# Patient Record
Sex: Female | Born: 1995 | Race: Black or African American | Hispanic: No | Marital: Single | Smoking: Former smoker
Health system: Southern US, Community
[De-identification: ages and names within clinical notes are randomized; demographics above are authoritative.]

## PROBLEM LIST (undated history)

## (undated) DIAGNOSIS — K529 Noninfective gastroenteritis and colitis, unspecified: Secondary | ICD-10-CM

## (undated) DIAGNOSIS — B3781 Candidal esophagitis: Secondary | ICD-10-CM

## (undated) DIAGNOSIS — K859 Acute pancreatitis without necrosis or infection, unspecified: Secondary | ICD-10-CM

## (undated) HISTORY — PX: NO PAST SURGERIES: SHX2092

---
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
## 2015-08-08 DIAGNOSIS — S66211A Strain of extensor muscle, fascia and tendon of right thumb at wrist and hand level, initial encounter: Secondary | ICD-10-CM | POA: Diagnosis not present

## 2015-08-08 DIAGNOSIS — Z113 Encounter for screening for infections with a predominantly sexual mode of transmission: Secondary | ICD-10-CM | POA: Diagnosis not present

## 2015-08-08 DIAGNOSIS — Z7251 High risk heterosexual behavior: Secondary | ICD-10-CM | POA: Diagnosis not present

## 2015-08-18 DIAGNOSIS — M545 Low back pain: Secondary | ICD-10-CM | POA: Diagnosis not present

## 2015-08-25 DIAGNOSIS — M545 Low back pain: Secondary | ICD-10-CM | POA: Diagnosis not present

## 2015-09-18 DIAGNOSIS — E559 Vitamin D deficiency, unspecified: Secondary | ICD-10-CM | POA: Diagnosis not present

## 2015-09-18 DIAGNOSIS — M545 Low back pain: Secondary | ICD-10-CM | POA: Diagnosis not present

## 2015-12-16 ENCOUNTER — Encounter (HOSPITAL_BASED_OUTPATIENT_CLINIC_OR_DEPARTMENT_OTHER): Payer: Self-pay | Admitting: *Deleted

## 2015-12-16 ENCOUNTER — Emergency Department (HOSPITAL_BASED_OUTPATIENT_CLINIC_OR_DEPARTMENT_OTHER)
Admission: EM | Admit: 2015-12-16 | Discharge: 2015-12-17 | Disposition: A | Discharge status: Home / Self Care | Payer: BLUE CROSS/BLUE SHIELD | Source: Home / Self Care | Attending: Emergency Medicine | Admitting: Emergency Medicine

## 2015-12-16 DIAGNOSIS — R109 Unspecified abdominal pain: Secondary | ICD-10-CM | POA: Diagnosis not present

## 2015-12-16 DIAGNOSIS — R1084 Generalized abdominal pain: Secondary | ICD-10-CM | POA: Diagnosis present

## 2015-12-16 DIAGNOSIS — N390 Urinary tract infection, site not specified: Secondary | ICD-10-CM

## 2015-12-16 DIAGNOSIS — N83209 Unspecified ovarian cyst, unspecified side: Secondary | ICD-10-CM | POA: Diagnosis not present

## 2015-12-16 DIAGNOSIS — Z202 Contact with and (suspected) exposure to infections with a predominantly sexual mode of transmission: Secondary | ICD-10-CM | POA: Diagnosis not present

## 2015-12-16 DIAGNOSIS — F17200 Nicotine dependence, unspecified, uncomplicated: Secondary | ICD-10-CM | POA: Insufficient documentation

## 2015-12-16 DIAGNOSIS — N83299 Other ovarian cyst, unspecified side: Secondary | ICD-10-CM | POA: Diagnosis not present

## 2015-12-16 DIAGNOSIS — F17210 Nicotine dependence, cigarettes, uncomplicated: Secondary | ICD-10-CM | POA: Diagnosis not present

## 2015-12-16 HISTORY — DX: Noninfective gastroenteritis and colitis, unspecified: K52.9

## 2015-12-16 LAB — WET PREP, GENITAL
Clue Cells Wet Prep HPF POC: NONE SEEN
Sperm: NONE SEEN
Trich, Wet Prep: NONE SEEN
Yeast Wet Prep HPF POC: NONE SEEN

## 2015-12-16 LAB — PREGNANCY, URINE: PREG TEST UR: NEGATIVE

## 2015-12-16 LAB — URINALYSIS, ROUTINE W REFLEX MICROSCOPIC
Glucose, UA: NEGATIVE mg/dL
Hgb urine dipstick: NEGATIVE
Ketones, ur: NEGATIVE mg/dL
Nitrite: NEGATIVE
Protein, ur: NEGATIVE mg/dL
Specific Gravity, Urine: 1.039 — ABNORMAL HIGH (ref 1.005–1.030)
pH: 6 (ref 5.0–8.0)

## 2015-12-16 LAB — URINE MICROSCOPIC-ADD ON

## 2015-12-16 NOTE — ED Notes (Signed)
Pelvic cart placed outside pt's room

## 2015-12-16 NOTE — ED Triage Notes (Signed)
Pt describes left side abd pain onset 6p. Denies other s/s. Last BM 7p. Menses 2 weeks ago. Denies UTI s/s.

## 2015-12-16 NOTE — ED Provider Notes (Signed)
[facility] Note   CSN: [id number] Arrival date & time: 12/16/15  2240  First Provider Contact:   First MD Initiated Contact with Patient 12/16/15 2313      By signing my name below, I, Soijett Blue, attest that this documentation has been prepared under the direction and in the presence of Tomasita Crumble, MD. Electronically Signed: Soijett Blue, ED Scribe. 12/16/15. 11:16 PM.    History   Chief Complaint Chief Complaint  Patient presents with  . Abdominal Pain    HPI  Debra Thornton is a 20 y.o. female who presents to the Emergency Department complaining of left sided abdominal pain onset 6:40 PM tonight. Pt notes that she was at work tonight when she had sudden onset left sided abdominal pain. Pt reports that her left sided abdominal pain radiated to her lower abdomen. Denies her abdominal pain radiating to her back. Pt states that her current symptoms feel similar to the start of her menstrual cycle. Pt notes that she was seen in the ED in December and dx with colitis and treated with abx. She states that she has not tried any medications for the relief for her symptoms. She denies vomiting, diarrhea, difficulty urinating, dysuria, hematuria, vaginal bleeding, vaginal discharge, fever, and any other symptoms.  The history is provided by the patient. No language interpreter was used.    Past Medical History:  Diagnosis Date  . Colitis     There are no active problems to display for this patient.   History reviewed. No pertinent surgical history.  OB History    No data available       Home Medications    Prior to Admission medications   Not on File    Family History History reviewed. No pertinent family history.  Social History Social History  Substance Use Topics  . Smoking status: Current Every Day Smoker  . Smokeless tobacco: Never Used  . Alcohol use No     Allergies   Review of patient's allergies indicates no known  allergies.   Review of Systems Review of Systems  A complete 10 system review of systems was obtained and all systems are negative except as noted in the HPI and PMH.    Physical Exam Updated Vital Signs BP 114/77 (BP Location: Left Arm)   Pulse 80   Temp 98.3 F (36.8 C) (Oral)   Resp 18   Ht 5' 4.5" (1.638 m)   Wt 175 lb (79.4 kg)   LMP 12/02/2015 (Exact Date)   SpO2 100%   BMI 29.57 kg/m   Physical Exam  Constitutional: She is oriented to person, place, and time. She appears well-developed and well-nourished. No distress.  HENT:  Head: Normocephalic and atraumatic.  Nose: Nose normal.  Mouth/Throat: Oropharynx is clear and moist. No oropharyngeal exudate.  Eyes: Conjunctivae and EOM are normal. Pupils are equal, round, and reactive to light. No scleral icterus.  Neck: Normal range of motion. Neck supple. No JVD present. No tracheal deviation present. No thyromegaly present.  Cardiovascular: Normal rate, regular rhythm and normal heart sounds.  Exam reveals no gallop and no friction rub.   No murmur heard. Pulmonary/Chest: Effort normal and breath sounds normal. No respiratory distress. She has no wheezes. She exhibits no tenderness.  Abdominal: Soft. Bowel sounds are normal. She exhibits no distension and no mass. There is no tenderness. There is no rebound and no guarding.  Musculoskeletal: Normal range of motion. She exhibits no edema or tenderness.  Lymphadenopathy:  She has no cervical adenopathy.  Neurological: She is alert and oriented to person, place, and time. No cranial nerve deficit. She exhibits normal muscle tone.  Skin: Skin is warm and dry. No rash noted. No erythema. No pallor.  Nursing note and vitals reviewed.    ED Treatments / Results  DIAGNOSTIC STUDIES: Oxygen Saturation is 100% on RA, nl by my interpretation.    COORDINATION OF CARE: 11:16 PM Discussed treatment plan with pt at bedside which includes UA, labs, and pt agreed to  plan.   Labs (all labs ordered are listed, but only abnormal results are displayed) Labs Reviewed  WET PREP, GENITAL - Abnormal; Notable for the following:       Result Value   WBC, Wet Prep HPF POC MANY (*)    All other components within normal limits  URINALYSIS, ROUTINE W REFLEX MICROSCOPIC (NOT AT [facility]) - Abnormal; Notable for the following:    APPearance CLOUDY (*)    Specific Gravity, Urine 1.039 (*)    Bilirubin Urine SMALL (*)    Leukocytes, UA TRACE (*)    All other components within normal limits  URINE MICROSCOPIC-ADD ON - Abnormal; Notable for the following:    Squamous Epithelial / LPF 6-30 (*)    Bacteria, UA MANY (*)    All other components within normal limits  COMPREHENSIVE METABOLIC PANEL - Abnormal; Notable for the following:    ALT 11 (*)    All other components within normal limits  PREGNANCY, URINE  LIPASE, BLOOD  CBC WITH DIFFERENTIAL/PLATELET  HIV ANTIBODY (ROUTINE TESTING)  CBC WITH DIFFERENTIAL/PLATELET  GC/CHLAMYDIA PROBE AMP ([place]) NOT AT [facility]    EKG  EKG Interpretation None       Radiology No results found.  Procedures Procedures (including critical care time)  Medications Ordered in ED Medications  cefTRIAXone (ROCEPHIN) injection 250 mg (not administered)  azithromycin (ZITHROMAX) tablet 1,000 mg (not administered)     Initial Impression / Assessment and Plan / ED Course  I have reviewed the triage vital signs and the nursing notes.  Pertinent labs & imaging results that were available during my care of the patient were reviewed by me and considered in my medical decision making (see chart for details).  Clinical Course    Patient presents to the ED for lower abdominal pain.  She will need pelvic exam, I am awaiting the set up.  UA does reveal some bacteria.  Labs pending.  Pelvic exam show moderate DC, friable cervix with mild bleeding, no CMT or adnexal tenderness.  She could have an STD.  Will treat in the ED for  ceftriaxcone and azithro, Dc home with macrobid for possible UTI.  PCP fu advised within 3 days.  VS remain within her normal limits and she is safe for DC.  Final Clinical Impressions(s) / ED Diagnoses   Final diagnoses:  None    New Prescriptions New Prescriptions   No medications on file     I personally performed the services described in this documentation, which was scribed in my presence. The recorded information has been reviewed and is accurate.       Tomasita CrumbleAdeleke Quinita Kostelecky, MD 12/17/15 [phone]

## 2015-12-17 ENCOUNTER — Emergency Department (HOSPITAL_COMMUNITY): Payer: BLUE CROSS/BLUE SHIELD

## 2015-12-17 ENCOUNTER — Emergency Department (HOSPITAL_COMMUNITY)
Admission: EM | Admit: 2015-12-17 | Discharge: 2015-12-17 | Disposition: A | Discharge status: Home / Self Care | Payer: BLUE CROSS/BLUE SHIELD | Attending: Emergency Medicine | Admitting: Emergency Medicine

## 2015-12-17 ENCOUNTER — Encounter (HOSPITAL_COMMUNITY): Payer: Self-pay | Admitting: Emergency Medicine

## 2015-12-17 DIAGNOSIS — N83209 Unspecified ovarian cyst, unspecified side: Secondary | ICD-10-CM | POA: Diagnosis not present

## 2015-12-17 DIAGNOSIS — R109 Unspecified abdominal pain: Secondary | ICD-10-CM

## 2015-12-17 DIAGNOSIS — F17210 Nicotine dependence, cigarettes, uncomplicated: Secondary | ICD-10-CM | POA: Insufficient documentation

## 2015-12-17 DIAGNOSIS — N83299 Other ovarian cyst, unspecified side: Secondary | ICD-10-CM | POA: Insufficient documentation

## 2015-12-17 LAB — COMPREHENSIVE METABOLIC PANEL
ALBUMIN: 4.7 g/dL (ref 3.5–5.0)
ALK PHOS: 49 U/L (ref 38–126)
ALT: 15 U/L (ref 14–54)
ALT: 11 U/L — AB (ref 14–54)
ANION GAP: 13 (ref 5–15)
AST: 22 U/L (ref 15–41)
AST: 17 U/L (ref 15–41)
Albumin: 4.1 g/dL (ref 3.5–5.0)
Alkaline Phosphatase: 44 U/L (ref 38–126)
Anion gap: 6 (ref 5–15)
BUN: 12 mg/dL (ref 6–20)
BUN: 13 mg/dL (ref 6–20)
CHLORIDE: 106 mmol/L (ref 101–111)
CO2: 19 mmol/L — AB (ref 22–32)
CO2: 24 mmol/L (ref 22–32)
CREATININE: 0.81 mg/dL (ref 0.44–1.00)
Calcium: 9.2 mg/dL (ref 8.9–10.3)
Calcium: 9.8 mg/dL (ref 8.9–10.3)
Chloride: 108 mmol/L (ref 101–111)
Creatinine, Ser: 0.88 mg/dL (ref 0.44–1.00)
GFR calc Af Amer: >60 mL/min (ref >60)
GFR calc Af Amer: >60 mL/min (ref >60)
GFR calc non Af Amer: >60 mL/min (ref >60)
GLUCOSE: 144 mg/dL — AB (ref 65–99)
Glucose, Bld: 90 mg/dL (ref 65–99)
POTASSIUM: 3 mmol/L — AB (ref 3.5–5.1)
Potassium: 4 mmol/L (ref 3.5–5.1)
SODIUM: 138 mmol/L (ref 135–145)
SODIUM: 138 mmol/L (ref 135–145)
TOTAL PROTEIN: 7.2 g/dL (ref 6.5–8.1)
Total Bilirubin: 0.5 mg/dL (ref 0.3–1.2)
Total Bilirubin: 0.5 mg/dL (ref 0.3–1.2)
Total Protein: 7.7 g/dL (ref 6.5–8.1)

## 2015-12-17 LAB — URINALYSIS, ROUTINE W REFLEX MICROSCOPIC
Bilirubin Urine: NEGATIVE
GLUCOSE, UA: NEGATIVE mg/dL
Hgb urine dipstick: NEGATIVE
KETONES UR: 15 mg/dL — AB
LEUKOCYTES UA: NEGATIVE
NITRITE: NEGATIVE
PROTEIN: NEGATIVE mg/dL
Specific Gravity, Urine: 1.028 (ref 1.005–1.030)
pH: 7 (ref 5.0–8.0)

## 2015-12-17 LAB — I-STAT BETA HCG BLOOD, ED (MC, WL, AP ONLY)

## 2015-12-17 LAB — CBC WITH DIFFERENTIAL/PLATELET
BASOS ABS: 0 10*3/uL (ref 0.0–0.1)
BASOS PCT: 0 %
BASOS PCT: 0 %
Basophils Absolute: 0 10*3/uL (ref 0.0–0.1)
EOS ABS: 0.1 10*3/uL (ref 0.0–0.7)
EOS ABS: 0.3 10*3/uL (ref 0.0–0.7)
EOS PCT: 4 %
Eosinophils Relative: 0 %
HCT: 34.6 % — ABNORMAL LOW (ref 36.0–46.0)
HCT: 39.4 % (ref 36.0–46.0)
HEMOGLOBIN: 13.5 g/dL (ref 12.0–15.0)
Hemoglobin: 12.1 g/dL (ref 12.0–15.0)
Lymphocytes Relative: 11 %
Lymphocytes Relative: 37 %
Lymphs Abs: 1.9 10*3/uL (ref 0.7–4.0)
Lymphs Abs: 2.6 10*3/uL (ref 0.7–4.0)
MCH: 30.5 pg (ref 26.0–34.0)
MCH: 31 pg (ref 26.0–34.0)
MCHC: 34.3 g/dL (ref 30.0–36.0)
MCHC: 35 g/dL (ref 30.0–36.0)
MCV: 88.7 fL (ref 78.0–100.0)
MCV: 88.9 fL (ref 78.0–100.0)
MONO ABS: 0.7 10*3/uL (ref 0.1–1.0)
MONOS PCT: 5 %
Monocytes Absolute: 0.9 10*3/uL (ref 0.1–1.0)
Monocytes Relative: 10 %
NEUTROS PCT: 84 %
Neutro Abs: 14.9 10*3/uL — ABNORMAL HIGH (ref 1.7–7.7)
Neutro Abs: 3.4 10*3/uL (ref 1.7–7.7)
Neutrophils Relative %: 49 %
PLATELETS: 187 10*3/uL (ref 150–400)
Platelets: 197 10*3/uL (ref 150–400)
RBC: 3.9 MIL/uL (ref 3.87–5.11)
RBC: 4.43 MIL/uL (ref 3.87–5.11)
RDW: 12.1 % (ref 11.5–15.5)
RDW: 12.2 % (ref 11.5–15.5)
WBC: 17.8 10*3/uL — ABNORMAL HIGH (ref 4.0–10.5)
WBC: 7 10*3/uL (ref 4.0–10.5)

## 2015-12-17 LAB — POC URINE PREG, ED: Preg Test, Ur: NEGATIVE

## 2015-12-17 LAB — LIPASE, BLOOD
LIPASE: 39 U/L (ref 11–51)
Lipase: 27 U/L (ref 11–51)

## 2015-12-17 MED ORDER — IOPAMIDOL (ISOVUE-300) INJECTION 61%
INTRAVENOUS | Status: AC
  Administered 2015-12-17: 100 mL
  Filled 2015-12-17: qty 100

## 2015-12-17 MED ORDER — CEFTRIAXONE SODIUM 250 MG IJ SOLR
250.0000 mg | Freq: Once | INTRAMUSCULAR | Status: AC
  Administered 2015-12-17: 250 mg via INTRAMUSCULAR
  Filled 2015-12-17: qty 250

## 2015-12-17 MED ORDER — KETOROLAC TROMETHAMINE 15 MG/ML IJ SOLN
15.0000 mg | Freq: Once | INTRAMUSCULAR | Status: AC
  Administered 2015-12-17: 15 mg via INTRAVENOUS
  Filled 2015-12-17: qty 1

## 2015-12-17 MED ORDER — OXYCODONE-ACETAMINOPHEN 5-325 MG PO TABS: 1.0000 | ORAL_TABLET | ORAL | 0 refills | Status: DC | PRN

## 2015-12-17 MED ORDER — AZITHROMYCIN 250 MG PO TABS
1000.0000 mg | ORAL_TABLET | Freq: Once | ORAL | Status: AC
  Administered 2015-12-17: 1000 mg via ORAL
  Filled 2015-12-17: qty 4

## 2015-12-17 MED ORDER — NITROFURANTOIN MONOHYD MACRO 100 MG PO CAPS: 100.0000 mg | ORAL_CAPSULE | Freq: Two times a day (BID) | ORAL | 0 refills | Status: DC

## 2015-12-17 MED ORDER — LOPERAMIDE HCL 2 MG PO CAPS
4.0000 mg | ORAL_CAPSULE | Freq: Once | ORAL | Status: AC
  Administered 2015-12-17: 4 mg via ORAL
  Filled 2015-12-17: qty 2

## 2015-12-17 MED ORDER — LIDOCAINE HCL (PF) 1 % IJ SOLN
INTRAMUSCULAR | Status: AC
  Administered 2015-12-17: 5 mL
  Filled 2015-12-17: qty 5

## 2015-12-17 MED ORDER — MORPHINE SULFATE (PF) 4 MG/ML IV SOLN
4.0000 mg | Freq: Once | INTRAVENOUS | Status: AC
  Administered 2015-12-17: 4 mg via INTRAVENOUS
  Filled 2015-12-17: qty 1

## 2015-12-17 MED ORDER — ONDANSETRON HCL 4 MG/2ML IJ SOLN
4.0000 mg | Freq: Once | INTRAMUSCULAR | Status: AC
  Administered 2015-12-17: 4 mg via INTRAVENOUS
  Filled 2015-12-17: qty 2

## 2015-12-17 MED ORDER — SODIUM CHLORIDE 0.9 % IV BOLUS (SEPSIS)
1000.0000 mL | Freq: Once | INTRAVENOUS | Status: AC
  Administered 2015-12-17: 1000 mL via INTRAVENOUS

## 2015-12-17 MED ORDER — POTASSIUM CHLORIDE 10 MEQ/100ML IV SOLN
10.0000 meq | Freq: Once | INTRAVENOUS | Status: AC
  Administered 2015-12-17: 10 meq via INTRAVENOUS
  Filled 2015-12-17: qty 100

## 2015-12-17 MED FILL — OXYCODONE/APAP 5-325: 5-325 | 1 days supply | Qty: 8 | Fill #0

## 2015-12-17 NOTE — ED Triage Notes (Signed)
Pt arrives with c/o generalized abdominal pain since 6PM last night, N/V/D. States hx colitis.

## 2015-12-17 NOTE — ED Notes (Signed)
IV team re-paged.  °

## 2015-12-17 NOTE — ED Notes (Signed)
Family updated about reason for delay and waiting for IV team.

## 2015-12-17 NOTE — ED Provider Notes (Signed)
[facility] Provider Note   CSN: [id number] Arrival date & time: 12/17/15  [id number]  First Provider Contact:  None       History   Chief Complaint Chief Complaint  Patient presents with  . Abdominal Pain    HPI Debra Thornton is a 20 y.o. female.  HPI  This is a 20 year old female who presents with persistent abdominal pain, vomiting, and diarrhea. She was seen and evaluated approximately 24 hours ago for the same. At that time she was thought to have a UTI. Mother reports that she has not gotten any better. She reports crampy and sometimes sharp diffuse abdominal pain that is nonradiating. Currently her pain is 10 out of 10. She reports vomiting or diarrhea. Nonbloody. Denies any urinary symptoms. Reports history of the same when she was diagnosed with colitis. This was in December of last year and she was prescribed Cipro.  Patient had full laboratory evaluation during prior evaluation including STD testing and pelvic exam.  Past Medical History:  Diagnosis Date  . Colitis     There are no active problems to display for this patient.   History reviewed. No pertinent surgical history.  OB History    No data available       Home Medications    Prior to Admission medications   Medication Sig Start Date End Date Taking? Authorizing Provider  nitrofurantoin, macrocrystal-monohydrate, (MACROBID) 100 MG capsule Take 1 capsule (100 mg total) by mouth 2 (two) times daily. 12/17/15   Tomasita CrumbleAdeleke Oni, MD    Family History No family history on file.  Social History Social History  Substance Use Topics  . Smoking status: Current Every Day Smoker    Types: Cigarettes  . Smokeless tobacco: Never Used  . Alcohol use No     Allergies   Review of patient's allergies indicates no known allergies.   Review of Systems Review of Systems  Constitutional: Negative for fever.  Respiratory: Negative for shortness of breath.   Cardiovascular: Negative for chest pain.    Gastrointestinal: Positive for abdominal pain, diarrhea, nausea and vomiting.  Genitourinary: Negative for dysuria and vaginal discharge.  All other systems reviewed and are negative.    Physical Exam Updated Vital Signs BP 120/86 (BP Location: Right Arm)   Pulse 72   Temp 97.7 F (36.5 C) (Oral)   Resp 16   Ht 5\' 4"  (1.626 m)   Wt 175 lb (79.4 kg)   LMP 12/02/2015 (Exact Date)   SpO2 97%   BMI 30.04 kg/m   Physical Exam  Constitutional: She is oriented to person, place, and time. She appears well-developed and well-nourished. No distress.  HENT:  Head: Normocephalic and atraumatic.  Mucous membranes dry  Cardiovascular: Normal rate, regular rhythm and normal heart sounds.   No murmur heard. Pulmonary/Chest: Effort normal. No respiratory distress. She has no wheezes.  Abdominal: Soft. Bowel sounds are normal. She exhibits no mass. There is no tenderness. There is no guarding.  Neurological: She is alert and oriented to person, place, and time.  Skin: Skin is warm and dry.  Psychiatric: She has a normal mood and affect.  Nursing note and vitals reviewed.    ED Treatments / Results  Labs (all labs ordered are listed, but only abnormal results are displayed) Labs Reviewed  URINALYSIS, ROUTINE W REFLEX MICROSCOPIC (NOT AT [facility]) - Abnormal; Notable for the following:       Result Value   Ketones, ur 15 (*)    All other components  within normal limits  CBC WITH DIFFERENTIAL/PLATELET - Abnormal; Notable for the following:    WBC 17.8 (*)    Neutro Abs 14.9 (*)    All other components within normal limits  COMPREHENSIVE METABOLIC PANEL - Abnormal; Notable for the following:    Potassium 3.0 (*)    CO2 19 (*)    Glucose, Bld 144 (*)    All other components within normal limits  LIPASE, BLOOD  POC URINE PREG, ED  I-STAT BETA HCG BLOOD, ED ([facility], [facility], [facility] ONLY)    EKG  EKG Interpretation None       Radiology No results found.  Procedures Procedures (including  critical care time)  Medications Ordered in ED Medications  sodium chloride 0.9 % bolus 1,000 mL (not administered)  ondansetron (ZOFRAN) injection 4 mg (not administered)  loperamide (IMODIUM) capsule 4 mg (not administered)  morphine 4 MG/ML injection 4 mg (not administered)     Initial Impression / Assessment and Plan / ED Course  I have reviewed the triage vital signs and the nursing notes.  Pertinent labs & imaging results that were available during my care of the patient were reviewed by me and considered in my medical decision making (see chart for details).  Clinical Course    Patient presents with abdominal pain, nausea, vomiting. Otherwise nontoxic. History of colitis.  Patient looks dry. Given pain and nausea medication as well as fluids. Recent evaluation at [facility] and felt to have urinary tract infection. She had pelvic exam at that time. Clinically reports that she is gotten much worse. Now has a white count of 17. Will repeat CT scan.  Signed out to Dr. Juleen ChinaKohut pending CT.    Final Clinical Impressions(s) / ED Diagnoses   Final diagnoses:  None    New Prescriptions New Prescriptions   No medications on file     Shon Batonourtney F Nitin Mckowen, MD 12/18/15 [phone]

## 2015-12-17 NOTE — ED Notes (Addendum)
This RN attempted IV. Per MATT rn This RN was 4th ED rn to attempted IV access. IV team order was placed by night shift RN.

## 2015-12-17 NOTE — ED Notes (Signed)
IV team at bedside 

## 2015-12-18 ENCOUNTER — Emergency Department (HOSPITAL_COMMUNITY)
Admission: EM | Admit: 2015-12-18 | Discharge: 2015-12-18 | Disposition: A | Discharge status: Home / Self Care | Payer: BLUE CROSS/BLUE SHIELD | Attending: Emergency Medicine | Admitting: Emergency Medicine

## 2015-12-18 ENCOUNTER — Encounter (HOSPITAL_COMMUNITY): Payer: Self-pay | Admitting: Emergency Medicine

## 2015-12-18 DIAGNOSIS — F17210 Nicotine dependence, cigarettes, uncomplicated: Secondary | ICD-10-CM | POA: Insufficient documentation

## 2015-12-18 DIAGNOSIS — R109 Unspecified abdominal pain: Secondary | ICD-10-CM

## 2015-12-18 DIAGNOSIS — R1010 Upper abdominal pain, unspecified: Secondary | ICD-10-CM | POA: Insufficient documentation

## 2015-12-18 DIAGNOSIS — R1033 Periumbilical pain: Secondary | ICD-10-CM | POA: Insufficient documentation

## 2015-12-18 DIAGNOSIS — R1084 Generalized abdominal pain: Secondary | ICD-10-CM | POA: Diagnosis present

## 2015-12-18 LAB — CBC WITH DIFFERENTIAL/PLATELET
BASOS ABS: 0 10*3/uL (ref 0.0–0.1)
Basophils Relative: 0 %
EOS PCT: 0 %
Eosinophils Absolute: 0 10*3/uL (ref 0.0–0.7)
HEMATOCRIT: 35.4 % — AB (ref 36.0–46.0)
Hemoglobin: 12.5 g/dL (ref 12.0–15.0)
LYMPHS ABS: 1 10*3/uL (ref 0.7–4.0)
LYMPHS PCT: 11 %
MCH: 30.3 pg (ref 26.0–34.0)
MCHC: 35.3 g/dL (ref 30.0–36.0)
MCV: 85.9 fL (ref 78.0–100.0)
Monocytes Absolute: 0.7 10*3/uL (ref 0.1–1.0)
Monocytes Relative: 7 %
NEUTROS ABS: 8 10*3/uL — AB (ref 1.7–7.7)
Neutrophils Relative %: 82 %
PLATELETS: 217 10*3/uL (ref 150–400)
RBC: 4.12 MIL/uL (ref 3.87–5.11)
RDW: 12.1 % (ref 11.5–15.5)
WBC: 9.8 10*3/uL (ref 4.0–10.5)

## 2015-12-18 LAB — URINALYSIS, ROUTINE W REFLEX MICROSCOPIC
BILIRUBIN URINE: NEGATIVE
Glucose, UA: NEGATIVE mg/dL
HGB URINE DIPSTICK: NEGATIVE
Leukocytes, UA: NEGATIVE
NITRITE: NEGATIVE
Protein, ur: 30 mg/dL — AB
Specific Gravity, Urine: 1.04 — ABNORMAL HIGH (ref 1.005–1.030)
pH: 6 (ref 5.0–8.0)

## 2015-12-18 LAB — COMPREHENSIVE METABOLIC PANEL
ALK PHOS: 45 U/L (ref 38–126)
ALT: 13 U/L — AB (ref 14–54)
AST: 20 U/L (ref 15–41)
Albumin: 4.4 g/dL (ref 3.5–5.0)
Anion gap: 11 (ref 5–15)
BILIRUBIN TOTAL: 0.9 mg/dL (ref 0.3–1.2)
BUN: 7 mg/dL (ref 6–20)
CALCIUM: 9.7 mg/dL (ref 8.9–10.3)
CHLORIDE: 109 mmol/L (ref 101–111)
CO2: 19 mmol/L — ABNORMAL LOW (ref 22–32)
CREATININE: 0.91 mg/dL (ref 0.44–1.00)
Glucose, Bld: 117 mg/dL — ABNORMAL HIGH (ref 65–99)
Potassium: 2.9 mmol/L — ABNORMAL LOW (ref 3.5–5.1)
Sodium: 139 mmol/L (ref 135–145)
TOTAL PROTEIN: 7.5 g/dL (ref 6.5–8.1)

## 2015-12-18 LAB — GC/CHLAMYDIA PROBE AMP (~~LOC~~) NOT AT ARMC
Chlamydia: NEGATIVE
Neisseria Gonorrhea: NEGATIVE

## 2015-12-18 LAB — LIPASE, BLOOD: LIPASE: 21 U/L (ref 11–51)

## 2015-12-18 LAB — HIV ANTIBODY (ROUTINE TESTING W REFLEX): HIV SCREEN 4TH GENERATION: NONREACTIVE

## 2015-12-18 LAB — URINE MICROSCOPIC-ADD ON

## 2015-12-18 LAB — POC URINE PREG, ED: Preg Test, Ur: NEGATIVE

## 2015-12-18 MED ORDER — KETOROLAC TROMETHAMINE 30 MG/ML IJ SOLN
30.0000 mg | Freq: Once | INTRAMUSCULAR | Status: AC
  Administered 2015-12-18: 30 mg via INTRAVENOUS
  Filled 2015-12-18: qty 1

## 2015-12-18 MED ORDER — ONDANSETRON HCL 4 MG/2ML IJ SOLN
4.0000 mg | Freq: Once | INTRAMUSCULAR | Status: AC
  Administered 2015-12-18: 4 mg via INTRAVENOUS
  Filled 2015-12-18: qty 2

## 2015-12-18 MED ORDER — ONDANSETRON 8 MG PO TBDP: ORAL_TABLET | ORAL | 0 refills | Status: DC

## 2015-12-18 MED ORDER — MORPHINE SULFATE (PF) 4 MG/ML IV SOLN
4.0000 mg | Freq: Once | INTRAVENOUS | Status: AC
  Administered 2015-12-18: 4 mg via INTRAVENOUS
  Filled 2015-12-18: qty 1

## 2015-12-18 MED ORDER — SODIUM CHLORIDE 0.9 % IV BOLUS (SEPSIS)
1000.0000 mL | Freq: Once | INTRAVENOUS | Status: AC
  Administered 2015-12-18: 1000 mL via INTRAVENOUS

## 2015-12-18 MED ORDER — CIPROFLOXACIN HCL 500 MG PO TABS: 500.0000 mg | ORAL_TABLET | Freq: Two times a day (BID) | ORAL | 0 refills | Status: DC

## 2015-12-18 MED FILL — ONDANSETRON ODT 8 MG TABLET: 8 | 2 days supply | Qty: 6 | Fill #0

## 2015-12-18 MED FILL — CIPROFLOXACIN HCL 500 MG TA: 500 | 7 days supply | Qty: 14 | Fill #0

## 2015-12-18 NOTE — ED Provider Notes (Signed)
[facility] Provider Note   CSN: [id number] Arrival date & time: 12/18/15  0234     History   Chief Complaint Chief Complaint  Patient presents with  . Abdominal Pain    HPI Debra Thornton is a 20 y.o. female.  Patient is a 20 year old female with history of colitis diagnosed in December 2016. She presents for evaluation of generalized abdominal discomfort that has been worsening over the past week. She has been seen on 4 occasions with similar complaints. She was seen most recently yesterday and told she likely had a ruptured ovarian cyst. She returns today complaining of ongoing pain, nausea, vomiting, and being "unable to keep anything down". She denies any fevers or chills. She denies any abnormal bleeding or discharge.   The history is provided by the patient.  Abdominal Pain   This is a new problem. Episode onset: 1 week ago. The problem occurs constantly. The problem has been gradually worsening. The pain is moderate. Nothing aggravates the symptoms. Nothing relieves the symptoms.    Past Medical History:  Diagnosis Date  . Colitis     There are no active problems to display for this patient.   History reviewed. No pertinent surgical history.  OB History    No data available       Home Medications    Prior to Admission medications   Medication Sig Start Date End Date Taking? Authorizing Provider  nitrofurantoin, macrocrystal-monohydrate, (MACROBID) 100 MG capsule Take 1 capsule (100 mg total) by mouth 2 (two) times daily. Patient not taking: Reported on 12/17/2015 12/17/15   Tomasita CrumbleAdeleke Oni, MD  oxyCODONE-acetaminophen (PERCOCET/ROXICET) 5-325 MG tablet Take 1-2 tablets by mouth every 4 (four) hours as needed for severe pain. 12/17/15   Raeford RazorStephen Kohut, MD    Family History No family history on file.  Social History Social History  Substance Use Topics  . Smoking status: Current Every Day Smoker    Types: Cigarettes  . Smokeless tobacco: Never Used  .  Alcohol use No     Allergies   Review of patient's allergies indicates no known allergies.   Review of Systems Review of Systems  Gastrointestinal: Positive for abdominal pain.  All other systems reviewed and are negative.    Physical Exam Updated Vital Signs BP 129/86 (BP Location: Right Arm)   Pulse 89   Temp 98.3 F (36.8 C) (Oral)   Resp 20   LMP 12/02/2015 (Approximate)   SpO2 100%   Physical Exam  Constitutional: She is oriented to person, place, and time. She appears well-developed and well-nourished. No distress.  HENT:  Head: Normocephalic and atraumatic.  Neck: Normal range of motion. Neck supple.  Cardiovascular: Normal rate and regular rhythm.  Exam reveals no gallop and no friction rub.   No murmur heard. Pulmonary/Chest: Effort normal and breath sounds normal. No respiratory distress. She has no wheezes.  Abdominal: Soft. Bowel sounds are normal. She exhibits no distension. There is no tenderness.  Musculoskeletal: Normal range of motion. She exhibits tenderness.  There is tenderness to palpation across the upper abdomen. There is no rebound or guarding.  Neurological: She is alert and oriented to person, place, and time.  Skin: Skin is warm and dry. She is not diaphoretic.  Nursing note and vitals reviewed.    ED Treatments / Results  Labs (all labs ordered are listed, but only abnormal results are displayed) Labs Reviewed  CBC WITH DIFFERENTIAL/PLATELET - Abnormal; Notable for the following:       Result  Value   HCT 35.4 (*)    Neutro Abs 8.0 (*)    All other components within normal limits  COMPREHENSIVE METABOLIC PANEL - Abnormal; Notable for the following:    Potassium 2.9 (*)    CO2 19 (*)    Glucose, Bld 117 (*)    ALT 13 (*)    All other components within normal limits  URINALYSIS, ROUTINE W REFLEX MICROSCOPIC (NOT AT [facility]) - Abnormal; Notable for the following:    Color, Urine AMBER (*)    APPearance CLOUDY (*)    Specific Gravity,  Urine 1.040 (*)    Ketones, ur >80 (*)    Protein, ur 30 (*)    All other components within normal limits  URINE MICROSCOPIC-ADD ON - Abnormal; Notable for the following:    Squamous Epithelial / LPF 6-30 (*)    Bacteria, UA MANY (*)    All other components within normal limits  LIPASE, BLOOD  POC URINE PREG, ED    EKG  EKG Interpretation None       Radiology Ct Abdomen Pelvis W Contrast  Result Date: 12/17/2015 CLINICAL DATA:  Diffuse abdominal pain EXAM: CT ABDOMEN AND PELVIS WITH CONTRAST TECHNIQUE: Multidetector CT imaging of the abdomen and pelvis was performed using the standard protocol following bolus administration of intravenous contrast. CONTRAST:  100mL ISOVUE-300 IOPAMIDOL (ISOVUE-300) INJECTION 61% COMPARISON:  None. FINDINGS: Lower chest:  No acute findings. Hepatobiliary: No masses or other significant abnormality. Pancreas: No mass, inflammatory changes, or other significant abnormality. Spleen: Within normal limits in size and appearance. Adrenals/Urinary Tract: The adrenal glands are within normal limits. Kidneys demonstrate a normal enhancement pattern bilaterally. Bilateral renal calculi are noted. Largest of these lies in the upper pole on the left measuring 13 mm. No obstructive changes are noted. Stomach/Bowel: No evidence of obstruction, inflammatory process, or abnormal fluid collections. The appendix is within normal limits. Vascular/Lymphatic: No pathologically enlarged lymph nodes. No evidence of abdominal aortic aneurysm. Reproductive: Uterus is within normal limits. The right ovary is unremarkable. The left ovary is enlarged measuring 5.2 x 3.2 cm. Moderate free fluid is noted in the pelvis. These changes may represent an hemorrhagic ovarian cyst/ involuting cyst. Other: None. Musculoskeletal:  No suspicious bone lesions identified. IMPRESSION: Bilateral renal calculi without obstructive change. The appendix is within normal limits. Enlarged left ovary with  surrounding free fluid as described. This is likely the etiology of the patient's underlying discomfort. Electronically Signed   By: Alcide CleverMark  Lukens M.D.   On: 12/17/2015 09:40    Procedures Procedures (including critical care time)  Medications Ordered in ED Medications  ketorolac (TORADOL) 30 MG/ML injection 30 mg (not administered)  sodium chloride 0.9 % bolus 1,000 mL (not administered)  ondansetron (ZOFRAN) injection 4 mg (not administered)  morphine 4 MG/ML injection 4 mg (not administered)     Initial Impression / Assessment and Plan / ED Course  I have reviewed the triage vital signs and the nursing notes.  Pertinent labs & imaging results that were available during my care of the patient were reviewed by me and considered in my medical decision making (see chart for details).  Clinical Course      Final Clinical Impressions(s) / ED Diagnoses   Final diagnoses:  None   Patient presents with complaints of ongoing abdominal pain. She has been seen here several times recently with similar complaints. She underwent a CT scan yesterday which revealed evidence for a ruptured ovarian cyst. She returns today with ongoing  vomiting and inability keep her pain medication down.  Her laboratory studies today are unremarkable, and she is feeling better with IV fluids and medications given in the ER. I feel as though she is appropriate for discharge. What she will be given a prescription for Zofran she can take if feeling nauseated. She is to follow-up with her primary Dr. if not improving in the next 2-3 days to discuss a referral to gastroenterology.  New Prescriptions New Prescriptions   No medications on file     Geoffery Lyons, MD 12/18/15 [phone]

## 2015-12-18 NOTE — ED Notes (Signed)
MD at bedside. 

## 2015-12-18 NOTE — Discharge Instructions (Signed)
Continue your pain medication as prescribed.  You begin taking Zofran as needed for nausea.  Clear liquids for the next 24 hours, then advance to bland foods as tolerated.  Follow-up with your primary Dr. if symptoms persist over the next 2-3 days to discuss possible referral to a gastroenterology doctor.

## 2015-12-18 NOTE — ED Triage Notes (Signed)
Pt. reports generalized abdominal pain with nausea , denies emesis or diarrhea onset 2 days ago , no fever or chills.

## 2015-12-26 DIAGNOSIS — Z01411 Encounter for gynecological examination (general) (routine) with abnormal findings: Secondary | ICD-10-CM | POA: Diagnosis not present

## 2015-12-26 DIAGNOSIS — Z113 Encounter for screening for infections with a predominantly sexual mode of transmission: Secondary | ICD-10-CM | POA: Diagnosis not present

## 2015-12-26 DIAGNOSIS — N761 Subacute and chronic vaginitis: Secondary | ICD-10-CM | POA: Diagnosis not present

## 2015-12-26 DIAGNOSIS — N83209 Unspecified ovarian cyst, unspecified side: Secondary | ICD-10-CM | POA: Diagnosis not present

## 2016-01-23 DIAGNOSIS — N83292 Other ovarian cyst, left side: Secondary | ICD-10-CM | POA: Diagnosis not present

## 2016-01-23 DIAGNOSIS — N83209 Unspecified ovarian cyst, unspecified side: Secondary | ICD-10-CM | POA: Diagnosis not present

## 2016-01-23 MED FILL — ONDANSETRON HCL 4 MG TABLET: 4 | 10 days supply | Qty: 30 | Fill #0

## 2016-01-23 MED FILL — XULANE PATCH: 150-35 | 28 days supply | Qty: 3 | Fill #0

## 2016-02-14 DIAGNOSIS — A09 Infectious gastroenteritis and colitis, unspecified: Secondary | ICD-10-CM | POA: Diagnosis not present

## 2016-02-14 MED FILL — XULANE PATCH: 150-35 | 28 days supply | Qty: 3 | Fill #1

## 2016-02-15 DIAGNOSIS — R112 Nausea with vomiting, unspecified: Secondary | ICD-10-CM | POA: Diagnosis not present

## 2016-02-15 DIAGNOSIS — R1013 Epigastric pain: Secondary | ICD-10-CM | POA: Diagnosis not present

## 2016-02-15 DIAGNOSIS — R1084 Generalized abdominal pain: Secondary | ICD-10-CM | POA: Diagnosis not present

## 2016-02-15 DIAGNOSIS — F17210 Nicotine dependence, cigarettes, uncomplicated: Secondary | ICD-10-CM | POA: Diagnosis not present

## 2016-02-17 ENCOUNTER — Emergency Department (HOSPITAL_COMMUNITY)
Admission: EM | Admit: 2016-02-17 | Discharge: 2016-02-17 | Disposition: A | Discharge status: Home / Self Care | Payer: BLUE CROSS/BLUE SHIELD | Attending: Emergency Medicine | Admitting: Emergency Medicine

## 2016-02-17 ENCOUNTER — Encounter (HOSPITAL_COMMUNITY): Payer: Self-pay

## 2016-02-17 ENCOUNTER — Emergency Department (HOSPITAL_COMMUNITY): Payer: BLUE CROSS/BLUE SHIELD

## 2016-02-17 DIAGNOSIS — F17210 Nicotine dependence, cigarettes, uncomplicated: Secondary | ICD-10-CM | POA: Insufficient documentation

## 2016-02-17 DIAGNOSIS — R748 Abnormal levels of other serum enzymes: Secondary | ICD-10-CM | POA: Insufficient documentation

## 2016-02-17 DIAGNOSIS — E876 Hypokalemia: Secondary | ICD-10-CM | POA: Diagnosis not present

## 2016-02-17 DIAGNOSIS — R109 Unspecified abdominal pain: Secondary | ICD-10-CM | POA: Diagnosis not present

## 2016-02-17 DIAGNOSIS — R112 Nausea with vomiting, unspecified: Secondary | ICD-10-CM | POA: Diagnosis not present

## 2016-02-17 DIAGNOSIS — R945 Abnormal results of liver function studies: Secondary | ICD-10-CM | POA: Diagnosis not present

## 2016-02-17 DIAGNOSIS — R1010 Upper abdominal pain, unspecified: Secondary | ICD-10-CM

## 2016-02-17 DIAGNOSIS — K529 Noninfective gastroenteritis and colitis, unspecified: Secondary | ICD-10-CM | POA: Insufficient documentation

## 2016-02-17 DIAGNOSIS — R7989 Other specified abnormal findings of blood chemistry: Secondary | ICD-10-CM

## 2016-02-17 LAB — URINALYSIS, ROUTINE W REFLEX MICROSCOPIC
GLUCOSE, UA: NEGATIVE mg/dL
Nitrite: NEGATIVE
PH: 6 (ref 5.0–8.0)
PROTEIN: 30 mg/dL — AB
Specific Gravity, Urine: 1.03 (ref 1.005–1.030)

## 2016-02-17 LAB — COMPREHENSIVE METABOLIC PANEL
ALBUMIN: 4.5 g/dL (ref 3.5–5.0)
ALT: 188 U/L — AB (ref 14–54)
AST: 111 U/L — AB (ref 15–41)
Alkaline Phosphatase: 39 U/L (ref 38–126)
Anion gap: 14 (ref 5–15)
BUN: 14 mg/dL (ref 6–20)
CHLORIDE: 104 mmol/L (ref 101–111)
CO2: 21 mmol/L — AB (ref 22–32)
CREATININE: 0.92 mg/dL (ref 0.44–1.00)
Calcium: 10 mg/dL (ref 8.9–10.3)
GFR calc Af Amer: >60 mL/min (ref >60)
GLUCOSE: 122 mg/dL — AB (ref 65–99)
POTASSIUM: 2.9 mmol/L — AB (ref 3.5–5.1)
Sodium: 139 mmol/L (ref 135–145)
Total Bilirubin: 1.1 mg/dL (ref 0.3–1.2)
Total Protein: 8.6 g/dL — ABNORMAL HIGH (ref 6.5–8.1)

## 2016-02-17 LAB — CBC
HEMATOCRIT: 40.2 % (ref 36.0–46.0)
Hemoglobin: 14.5 g/dL (ref 12.0–15.0)
MCH: 31.2 pg (ref 26.0–34.0)
MCHC: 36.1 g/dL — AB (ref 30.0–36.0)
MCV: 86.5 fL (ref 78.0–100.0)
PLATELETS: 254 10*3/uL (ref 150–400)
RBC: 4.65 MIL/uL (ref 3.87–5.11)
RDW: 12.5 % (ref 11.5–15.5)
WBC: 10.4 10*3/uL (ref 4.0–10.5)

## 2016-02-17 LAB — URINE MICROSCOPIC-ADD ON

## 2016-02-17 LAB — LIPASE, BLOOD: LIPASE: 53 U/L — AB (ref 11–51)

## 2016-02-17 LAB — PREGNANCY, URINE: PREG TEST UR: NEGATIVE

## 2016-02-17 MED ORDER — IOPAMIDOL (ISOVUE-300) INJECTION 61%
INTRAVENOUS | Status: AC
  Administered 2016-02-17: 100 mL
  Filled 2016-02-17: qty 100

## 2016-02-17 MED ORDER — ONDANSETRON 4 MG PO TBDP
ORAL_TABLET | ORAL | Status: AC
  Administered 2016-02-17: 4 mg
  Filled 2016-02-17: qty 1

## 2016-02-17 MED ORDER — ONDANSETRON 8 MG PO TBDP: 8.0000 mg | ORAL_TABLET | Freq: Three times a day (TID) | ORAL | 0 refills | Status: DC | PRN

## 2016-02-17 MED ORDER — POTASSIUM CHLORIDE CRYS ER 20 MEQ PO TBCR: 20.0000 meq | EXTENDED_RELEASE_TABLET | Freq: Every day | ORAL | 0 refills | Status: DC

## 2016-02-17 MED ORDER — OXYCODONE HCL 5 MG PO TABS: 5.0000 mg | ORAL_TABLET | Freq: Four times a day (QID) | ORAL | 0 refills | Status: DC | PRN

## 2016-02-17 MED ORDER — POTASSIUM CHLORIDE CRYS ER 20 MEQ PO TBCR
40.0000 meq | EXTENDED_RELEASE_TABLET | Freq: Once | ORAL | Status: AC
  Administered 2016-02-17: 40 meq via ORAL
  Filled 2016-02-17: qty 2

## 2016-02-17 MED ORDER — METOCLOPRAMIDE HCL 5 MG/ML IJ SOLN
10.0000 mg | Freq: Once | INTRAMUSCULAR | Status: AC
  Administered 2016-02-17: 10 mg via INTRAVENOUS
  Filled 2016-02-17: qty 2

## 2016-02-17 MED ORDER — SODIUM CHLORIDE 0.9 % IV BOLUS (SEPSIS)
1000.0000 mL | Freq: Once | INTRAVENOUS | Status: AC
  Administered 2016-02-17: 1000 mL via INTRAVENOUS

## 2016-02-17 MED ORDER — POTASSIUM CHLORIDE 10 MEQ/100ML IV SOLN: 10.0000 meq | INTRAVENOUS | Status: DC

## 2016-02-17 MED ORDER — ONDANSETRON HCL 4 MG/2ML IJ SOLN
4.0000 mg | Freq: Once | INTRAMUSCULAR | Status: AC
  Administered 2016-02-17: 4 mg via INTRAVENOUS
  Filled 2016-02-17: qty 2

## 2016-02-17 MED ORDER — ONDANSETRON 4 MG PO TBDP: 4.0000 mg | ORAL_TABLET | Freq: Once | ORAL | Status: DC | PRN

## 2016-02-17 MED ORDER — POTASSIUM CHLORIDE CRYS ER 20 MEQ PO TBCR: 40.0000 meq | EXTENDED_RELEASE_TABLET | Freq: Once | ORAL | Status: DC

## 2016-02-17 NOTE — ED Triage Notes (Signed)
Pt reports abd pain, with vomiting. [facility]. Pt also told she has UTI. Pt reports she did not get abx filled for UTI.

## 2016-02-17 NOTE — Discharge Instructions (Signed)
You need to have your liver function tests rechecked in the next 5-7 days  Please avoid Tylenol and alcohol  Please take the medications as prescribed for nausea and pain  Please call the gastroenterology office for further evaluation  Please return to the emergency department for new or worsening symptoms

## 2016-02-17 NOTE — ED Notes (Signed)
Pt taken to CT via bed

## 2016-02-17 NOTE — ED Notes (Signed)
Patient returned from CT

## 2016-02-17 NOTE — ED Provider Notes (Signed)
[facility]PT Provider Note   CSN: [id number] Arrival date & time: 02/17/16  0941     History   Chief Complaint Chief Complaint  Patient presents with  . Emesis    HPI Debra Thornton is a 20 y.o. female.  Upper abdominal pain with vomiting for 2-3 days. She was seen at [facility] 2 days ago and sent home. She feels dehydrated. Not able to keep fluids down. Past medical history includes colitis. She is a Consulting civil engineer at [employer]. Nothing makes symptoms better or worse.      Past Medical History:  Diagnosis Date  . Colitis     There are no active problems to display for this patient.   History reviewed. No pertinent surgical history.  OB History    No data available       Home Medications    Prior to Admission medications   Medication Sig Start Date End Date Taking? Authorizing Provider  ciprofloxacin (CIPRO) 500 MG tablet Take 1 tablet (500 mg total) by mouth 2 (two) times daily. One po bid x 7 days Patient not taking: Reported on 02/17/2016 12/18/15   Geoffery Lyons, MD  nitrofurantoin, macrocrystal-monohydrate, (MACROBID) 100 MG capsule Take 1 capsule (100 mg total) by mouth 2 (two) times daily. Patient not taking: Reported on 02/17/2016 12/17/15   Tomasita Crumble, MD  ondansetron (ZOFRAN ODT) 8 MG disintegrating tablet 8mg  ODT q4 hours prn nausea Patient not taking: Reported on 02/17/2016 12/18/15   Geoffery Lyons, MD  oxyCODONE-acetaminophen (PERCOCET/ROXICET) 5-325 MG tablet Take 1-2 tablets by mouth every 4 (four) hours as needed for severe pain. Patient not taking: Reported on 02/17/2016 12/17/15   Raeford Razor, MD    Family History History reviewed. No pertinent family history.  Social History Social History  Substance Use Topics  . Smoking status: Current Every Day Smoker    Types: Cigarettes  . Smokeless tobacco: Never Used  . Alcohol use No     Allergies   Review of patient's allergies indicates no known allergies.   Review of  Systems Review of Systems  All other systems reviewed and are negative.    Physical Exam Updated Vital Signs BP 103/62   Pulse 81   Temp 98.8 F (37.1 C) (Oral)   Resp 18   Ht 5\' 4"  (1.626 m)   Wt 169 lb (76.7 kg)   LMP 01/27/2016 (Exact Date)   SpO2 100%   BMI 29.01 kg/m   Physical Exam  Constitutional: She is oriented to person, place, and time. She appears well-developed and well-nourished.  HENT:  Head: Normocephalic and atraumatic.  Eyes: Conjunctivae are normal.  Neck: Neck supple.  Cardiovascular: Normal rate and regular rhythm.   Pulmonary/Chest: Effort normal and breath sounds normal.  Abdominal:  Minimal tenderness upper abdomen.  Musculoskeletal: Normal range of motion.  Neurological: She is alert and oriented to person, place, and time.  Skin: Skin is warm and dry.  Psychiatric: She has a normal mood and affect. Her behavior is normal.  Nursing note and vitals reviewed.    ED Treatments / Results  Labs (all labs ordered are listed, but only abnormal results are displayed) Labs Reviewed  LIPASE, BLOOD - Abnormal; Notable for the following:       Result Value   Lipase 53 (*)    All other components within normal limits  COMPREHENSIVE METABOLIC PANEL - Abnormal; Notable for the following:    Potassium 2.9 (*)    CO2 21 (*)  Glucose, Bld 122 (*)    Total Protein 8.6 (*)    AST 111 (*)    ALT 188 (*)    All other components within normal limits  CBC - Abnormal; Notable for the following:    MCHC 36.1 (*)    All other components within normal limits  URINALYSIS, ROUTINE W REFLEX MICROSCOPIC (NOT AT [facility]) - Abnormal; [facility] the following:    Color, Urine ORANGE (*)    APPearance CLOUDY (*)    Hgb urine dipstick LARGE (*)    Bilirubin Urine SMALL (*)    Ketones, ur >80 (*)    Protein, ur 30 (*)    Leukocytes, UA TRACE (*)    All other components within normal limits  URINE MICROSCOPIC-ADD ON - Abnormal; Notable for the following:     Squamous Epithelial / LPF 0-5 (*)    Bacteria, UA RARE (*)    All other components within normal limits    EKG  EKG Interpretation None       Radiology No results found.  Procedures Procedures (including critical care time)  Medications Ordered in ED Medications  ondansetron (ZOFRAN-ODT) disintegrating tablet 4 mg (not administered)  potassium chloride SA (K-DUR,KLOR-CON) CR tablet 40 mEq (not administered)  sodium chloride 0.9 % bolus 1,000 mL (not administered)  ondansetron (ZOFRAN-ODT) 4 MG disintegrating tablet (4 mg  Given 02/17/16 1020)  sodium chloride 0.9 % bolus 1,000 mL (0 mLs Intravenous Stopped 02/17/16 1500)  ondansetron (ZOFRAN) injection 4 mg (4 mg Intravenous Given 02/17/16 1342)  sodium chloride 0.9 % bolus 1,000 mL (0 mLs Intravenous Stopped 02/17/16 1500)  metoCLOPramide (REGLAN) injection 10 mg (10 mg Intravenous Given 02/17/16 1342)     Initial Impression / Assessment and Plan / ED Course  I have reviewed the triage vital signs and the nursing notes.  Pertinent labs & imaging results that were available during my care of the patient were reviewed by me and considered in my medical decision making (see chart for details).  Clinical Course    Patient presents with upper abdominal pain and dehydration. She is hypokalemic with elevated lipase. Will obtain CT abdomen/pelvis. Discussed with Dr. Patria Maneampos  Final Clinical Impressions(s) / ED Diagnoses   Final diagnoses:  Gastroenteritis  Elevated lipase    New Prescriptions New Prescriptions   No medications on file     Donnetta HutchingBrian Zayvian Mcmurtry, MD 02/17/16 1557

## 2016-02-17 NOTE — ED Provider Notes (Signed)
ALT  Date Value Ref Range Status  02/17/2016 188 (H) 14 - 54 U/L Final  12/18/2015 13 (L) 14 - 54 U/L Final  12/17/2015 15 14 - 54 U/L Final  12/17/2015 11 (L) 14 - 54 U/L Final    AST  Date Value Ref Range Status  02/17/2016 111 (H) 15 - 41 U/L Final  12/18/2015 20 15 - 41 U/L Final  12/17/2015 22 15 - 41 U/L Final  12/17/2015 17 15 - 41 U/L Final    Patient feels much better at this time.  CT scan without acute intra-abdominal pathology.  I personally performed a right upper quadrant ultrasounds on no evidence of gallstones.  Elevated liver function tests noted.  Patient informed of these.  Acute hepatitis panel ordered.  Patient will need outpatient GI and primary care follow-up.  I told her she will need recheck of her liver function tests this week.  Discharged home with Zofran and oxycodone without Tylenol.  Patient told to avoid alcohol and Tylenol.  She understands to return to the ER for new or worsening symptoms    Azalia BilisKevin Leven Hoel, MD 02/17/16 1859

## 2016-02-18 MED FILL — KLOR-CON M20 TABLET: 20 | 4 days supply | Qty: 4 | Fill #0

## 2016-02-18 MED FILL — ONDANSETRON ODT 8 MG TABLET: 8 | 4 days supply | Qty: 10 | Fill #0

## 2016-02-18 MED FILL — oxyCODONE HCL 5 MG TABS: 5 | 3 days supply | Qty: 12 | Fill #0

## 2016-02-19 LAB — HEPATITIS PANEL, ACUTE
HEP A IGM: NEGATIVE
Hep B C IgM: NEGATIVE
Hepatitis B Surface Ag: NEGATIVE

## 2016-02-29 ENCOUNTER — Other Ambulatory Visit: Payer: Self-pay | Admitting: Gastroenterology

## 2016-02-29 DIAGNOSIS — R748 Abnormal levels of other serum enzymes: Secondary | ICD-10-CM | POA: Diagnosis not present

## 2016-02-29 DIAGNOSIS — R945 Abnormal results of liver function studies: Principal | ICD-10-CM

## 2016-02-29 DIAGNOSIS — R7989 Other specified abnormal findings of blood chemistry: Secondary | ICD-10-CM

## 2016-02-29 DIAGNOSIS — R109 Unspecified abdominal pain: Secondary | ICD-10-CM | POA: Diagnosis not present

## 2016-02-29 DIAGNOSIS — R112 Nausea with vomiting, unspecified: Secondary | ICD-10-CM | POA: Diagnosis not present

## 2016-03-07 ENCOUNTER — Encounter (HOSPITAL_COMMUNITY): Payer: Self-pay | Admitting: *Deleted

## 2016-03-07 DIAGNOSIS — K859 Acute pancreatitis without necrosis or infection, unspecified: Secondary | ICD-10-CM | POA: Diagnosis not present

## 2016-03-07 DIAGNOSIS — F17210 Nicotine dependence, cigarettes, uncomplicated: Secondary | ICD-10-CM | POA: Diagnosis not present

## 2016-03-07 DIAGNOSIS — Z79899 Other long term (current) drug therapy: Secondary | ICD-10-CM

## 2016-03-07 DIAGNOSIS — E876 Hypokalemia: Secondary | ICD-10-CM | POA: Diagnosis not present

## 2016-03-07 DIAGNOSIS — R109 Unspecified abdominal pain: Secondary | ICD-10-CM | POA: Diagnosis not present

## 2016-03-07 DIAGNOSIS — Z23 Encounter for immunization: Secondary | ICD-10-CM

## 2016-03-07 DIAGNOSIS — E860 Dehydration: Secondary | ICD-10-CM | POA: Diagnosis not present

## 2016-03-07 DIAGNOSIS — G43A1 Cyclical vomiting, intractable: Secondary | ICD-10-CM | POA: Diagnosis not present

## 2016-03-07 DIAGNOSIS — R1013 Epigastric pain: Secondary | ICD-10-CM | POA: Diagnosis not present

## 2016-03-07 DIAGNOSIS — R1915 Other abnormal bowel sounds: Secondary | ICD-10-CM | POA: Diagnosis not present

## 2016-03-07 DIAGNOSIS — N179 Acute kidney failure, unspecified: Secondary | ICD-10-CM | POA: Diagnosis not present

## 2016-03-07 DIAGNOSIS — R079 Chest pain, unspecified: Secondary | ICD-10-CM | POA: Diagnosis not present

## 2016-03-07 LAB — URINALYSIS, ROUTINE W REFLEX MICROSCOPIC
Glucose, UA: 100 mg/dL — AB
HGB URINE DIPSTICK: NEGATIVE
Ketones, ur: 40 mg/dL — AB
LEUKOCYTES UA: NEGATIVE
Nitrite: NEGATIVE
PROTEIN: 30 mg/dL — AB
Specific Gravity, Urine: 1.039 — ABNORMAL HIGH (ref 1.005–1.030)
pH: 7 (ref 5.0–8.0)

## 2016-03-07 LAB — URINE MICROSCOPIC-ADD ON

## 2016-03-07 LAB — COMPREHENSIVE METABOLIC PANEL
ALK PHOS: 37 U/L — AB (ref 38–126)
ALT: 17 U/L (ref 14–54)
ANION GAP: 9 (ref 5–15)
AST: 21 U/L (ref 15–41)
Albumin: 3.7 g/dL (ref 3.5–5.0)
BUN: 8 mg/dL (ref 6–20)
CALCIUM: 9.5 mg/dL (ref 8.9–10.3)
CHLORIDE: 106 mmol/L (ref 101–111)
CO2: 22 mmol/L (ref 22–32)
Creatinine, Ser: 1.28 mg/dL — ABNORMAL HIGH (ref 0.44–1.00)
GFR, EST NON AFRICAN AMERICAN: 60 mL/min — AB (ref >60)
Glucose, Bld: 104 mg/dL — ABNORMAL HIGH (ref 65–99)
Potassium: 3.2 mmol/L — ABNORMAL LOW (ref 3.5–5.1)
SODIUM: 137 mmol/L (ref 135–145)
Total Bilirubin: 0.9 mg/dL (ref 0.3–1.2)
Total Protein: 7.6 g/dL (ref 6.5–8.1)

## 2016-03-07 LAB — CBC
HCT: 36.8 % (ref 36.0–46.0)
HEMOGLOBIN: 12.8 g/dL (ref 12.0–15.0)
MCH: 30.4 pg (ref 26.0–34.0)
MCHC: 34.8 g/dL (ref 30.0–36.0)
MCV: 87.4 fL (ref 78.0–100.0)
Platelets: 337 10*3/uL (ref 150–400)
RBC: 4.21 MIL/uL (ref 3.87–5.11)
RDW: 12.9 % (ref 11.5–15.5)
WBC: 12.2 10*3/uL — AB (ref 4.0–10.5)

## 2016-03-07 LAB — POC URINE PREG, ED: PREG TEST UR: NEGATIVE

## 2016-03-07 LAB — LIPASE, BLOOD: Lipase: 101 U/L — ABNORMAL HIGH (ref 11–51)

## 2016-03-07 NOTE — ED Triage Notes (Signed)
The pt has had abd pain since Monday  No bm since Monday  n v  No diarrhea

## 2016-03-08 ENCOUNTER — Observation Stay (HOSPITAL_COMMUNITY)
Admission: EM | Admit: 2016-03-08 | Discharge: 2016-03-10 | DRG: 439 | Disposition: A | Discharge status: Home / Self Care | Payer: BLUE CROSS/BLUE SHIELD | Attending: Family Medicine | Admitting: Family Medicine

## 2016-03-08 ENCOUNTER — Emergency Department (HOSPITAL_COMMUNITY): Payer: BLUE CROSS/BLUE SHIELD

## 2016-03-08 DIAGNOSIS — F17210 Nicotine dependence, cigarettes, uncomplicated: Secondary | ICD-10-CM | POA: Diagnosis not present

## 2016-03-08 DIAGNOSIS — E876 Hypokalemia: Secondary | ICD-10-CM | POA: Diagnosis not present

## 2016-03-08 DIAGNOSIS — K859 Acute pancreatitis without necrosis or infection, unspecified: Secondary | ICD-10-CM

## 2016-03-08 DIAGNOSIS — R109 Unspecified abdominal pain: Secondary | ICD-10-CM | POA: Diagnosis not present

## 2016-03-08 DIAGNOSIS — R079 Chest pain, unspecified: Secondary | ICD-10-CM | POA: Diagnosis present

## 2016-03-08 DIAGNOSIS — E860 Dehydration: Secondary | ICD-10-CM | POA: Diagnosis not present

## 2016-03-08 DIAGNOSIS — N179 Acute kidney failure, unspecified: Secondary | ICD-10-CM

## 2016-03-08 DIAGNOSIS — Z23 Encounter for immunization: Secondary | ICD-10-CM | POA: Diagnosis not present

## 2016-03-08 DIAGNOSIS — R112 Nausea with vomiting, unspecified: Secondary | ICD-10-CM | POA: Diagnosis present

## 2016-03-08 DIAGNOSIS — Z79899 Other long term (current) drug therapy: Secondary | ICD-10-CM | POA: Diagnosis not present

## 2016-03-08 DIAGNOSIS — G43A1 Cyclical vomiting, intractable: Secondary | ICD-10-CM | POA: Diagnosis not present

## 2016-03-08 DIAGNOSIS — R1915 Other abnormal bowel sounds: Secondary | ICD-10-CM

## 2016-03-08 LAB — MAGNESIUM: MAGNESIUM: 2 mg/dL (ref 1.7–2.4)

## 2016-03-08 MED ORDER — SODIUM CHLORIDE 0.9 % IV SOLN
INTRAVENOUS | Status: DC
  Administered 2016-03-08 – 2016-03-09 (×4): via INTRAVENOUS

## 2016-03-08 MED ORDER — MORPHINE SULFATE (PF) 4 MG/ML IV SOLN
4.0000 mg | Freq: Once | INTRAVENOUS | Status: AC
  Administered 2016-03-08: 4 mg via INTRAVENOUS
  Filled 2016-03-08: qty 1

## 2016-03-08 MED ORDER — METOCLOPRAMIDE HCL 5 MG/ML IJ SOLN
10.0000 mg | Freq: Once | INTRAMUSCULAR | Status: AC
  Administered 2016-03-08: 10 mg via INTRAVENOUS
  Filled 2016-03-08: qty 2

## 2016-03-08 MED ORDER — ONDANSETRON HCL 4 MG/2ML IJ SOLN
4.0000 mg | Freq: Four times a day (QID) | INTRAMUSCULAR | Status: DC | PRN
  Administered 2016-03-08 – 2016-03-09 (×2): 4 mg via INTRAVENOUS
  Filled 2016-03-08 (×3): qty 2

## 2016-03-08 MED ORDER — METOCLOPRAMIDE HCL 5 MG/ML IJ SOLN
5.0000 mg | Freq: Three times a day (TID) | INTRAMUSCULAR | Status: DC
  Administered 2016-03-08 – 2016-03-10 (×5): 5 mg via INTRAVENOUS
  Filled 2016-03-08 (×5): qty 2

## 2016-03-08 MED ORDER — MORPHINE SULFATE (PF) 2 MG/ML IV SOLN: 2.0000 mg | INTRAVENOUS | Status: DC | PRN

## 2016-03-08 MED ORDER — SODIUM CHLORIDE 0.9 % IV SOLN
30.0000 meq | Freq: Once | INTRAVENOUS | Status: AC
  Administered 2016-03-08: 30 meq via INTRAVENOUS
  Filled 2016-03-08: qty 15

## 2016-03-08 MED ORDER — ONDANSETRON HCL 4 MG/2ML IJ SOLN
4.0000 mg | Freq: Once | INTRAMUSCULAR | Status: AC
  Administered 2016-03-08: 4 mg via INTRAVENOUS
  Filled 2016-03-08: qty 2

## 2016-03-08 MED ORDER — ENOXAPARIN SODIUM 40 MG/0.4ML ~~LOC~~ SOLN
40.0000 mg | SUBCUTANEOUS | Status: DC
  Administered 2016-03-08 – 2016-03-10 (×3): 40 mg via SUBCUTANEOUS
  Filled 2016-03-08 (×3): qty 0.4

## 2016-03-08 MED ORDER — MORPHINE SULFATE (PF) 4 MG/ML IV SOLN: 2.0000 mg | INTRAVENOUS | Status: DC | PRN

## 2016-03-08 MED ORDER — SODIUM CHLORIDE 0.9 % IV BOLUS (SEPSIS)
2000.0000 mL | Freq: Once | INTRAVENOUS | Status: AC
Start: 2016-03-08 — End: 2016-03-08
  Administered 2016-03-08: 2000 mL via INTRAVENOUS

## 2016-03-08 MED ORDER — INFLUENZA VAC SPLIT QUAD 0.5 ML IM SUSY
0.5000 mL | PREFILLED_SYRINGE | INTRAMUSCULAR | Status: AC
  Administered 2016-03-10: 0.5 mL via INTRAMUSCULAR
  Filled 2016-03-08: qty 0.5

## 2016-03-08 MED ORDER — MAGNESIUM SULFATE 2 GM/50ML IV SOLN
2.0000 g | Freq: Once | INTRAVENOUS | Status: DC
  Filled 2016-03-08: qty 50

## 2016-03-08 NOTE — ED Notes (Signed)
Attempted report 

## 2016-03-08 NOTE — ED Provider Notes (Signed)
[facility] Provider Note   CSN: [id number] Arrival date & time: 03/07/16  2132  By signing my name below, I, Rosario AdieWilliam Andrew Hiatt, attest that this documentation has been prepared under the direction and in the presence of Tomasita CrumbleAdeleke Charrie Mcconnon, MD. Electronically Signed: Rosario AdieWilliam Andrew Hiatt, ED Scribe. 03/08/16. 12:52 AM.  History   Chief Complaint Chief Complaint  Patient presents with  . Abdominal Pain   The history is provided by the patient and medical records. No language interpreter was used.   HPI Comments: Truitt MerleJala Didonato is a 20 y.o. female with a PMHx of colitis, who presents to the Emergency Department complaining of intermittent episodes of nausea and vomiting onset approximately 5 days ago. She reports associated epigastric abdominal pain and constipation secondary to her nausea and vomiting. Pt states that she has not had a bowel movement since the onset of her nausea and vomiting. She reports her symptoms began after eating spaghetti in her school's cafeteria. Other individuals ate this food w/o presentation of similar symptoms. No noted treatments were tried prior to coming into the ED. Pt was seen in the ED for an evaluation of similar symptoms on 02/17/16 (approximately 20 days ago), and at that time she had a CT A/P and US study performed, both of which were unremarkable. However, lab work revealed elevated LFTs at that time. She denies and alcohol consumption. She denies diarrhea, fever, urgency, frequency, hematuria, dysuria, difficulty urinating, or any other associated symptoms.   Past Medical History:  Diagnosis Date  . Colitis    Patient Active Problem List   Diagnosis Date Noted  . Chest pain 03/08/2016   History reviewed. No pertinent surgical history.  OB History    No data available     Home Medications    Prior to Admission medications   Medication Sig Start Date End Date Taking? Authorizing Provider  ondansetron (ZOFRAN ODT) 8 MG disintegrating tablet  Take 1 tablet (8 mg total) by mouth every 8 (eight) hours as needed for nausea or vomiting. Patient not taking: Reported on 03/08/2016 02/17/16   Azalia BilisKevin Campos, MD  oxyCODONE (ROXICODONE) 5 MG immediate release tablet Take 1 tablet (5 mg total) by mouth every 6 (six) hours as needed for severe pain. Patient not taking: Reported on 03/08/2016 02/17/16   Azalia BilisKevin Campos, MD  potassium chloride SA (K-DUR,KLOR-CON) 20 MEQ tablet Take 1 tablet (20 mEq total) by mouth daily. Patient not taking: Reported on 03/08/2016 02/17/16   Azalia BilisKevin Campos, MD    Family History No family history on file.  Social History Social History  Substance Use Topics  . Smoking status: Current Every Day Smoker    Types: Cigarettes  . Smokeless tobacco: Never Used  . Alcohol use No   Allergies   Review of patient's allergies indicates no known allergies.  Review of Systems Review of Systems 10 Systems reviewed and all are negative for acute change except as noted in the HPI.  Physical Exam Updated Vital Signs BP 135/98   Pulse 83   Temp 98.2 F (36.8 C) (Oral)   Resp 21   Ht 5\' 4"  (1.626 m)   Wt 169 lb (76.7 kg)   LMP 02/26/2016   SpO2 100%   BMI 29.01 kg/m   Physical Exam  Constitutional: She is oriented to person, place, and time. She appears well-developed and well-nourished. No distress.  HENT:  Head: Normocephalic and atraumatic.  Nose: Nose normal.  Mouth/Throat: Oropharynx is clear and moist. No oropharyngeal exudate.  Eyes: Conjunctivae  and EOM are normal. Pupils are equal, round, and reactive to light. No scleral icterus.  Neck: Normal range of motion. Neck supple. No JVD present. No tracheal deviation present. No thyromegaly present.  Cardiovascular: Normal rate, regular rhythm and normal heart sounds.  Exam reveals no gallop and no friction rub.   No murmur heard. Pulmonary/Chest: Effort normal and breath sounds normal. No respiratory distress. She has no wheezes. She exhibits no tenderness.    Abdominal: Soft. Bowel sounds are normal. She exhibits no distension and no mass. There is tenderness. There is no rebound and no guarding.  Midepigastric TTP.   Musculoskeletal: Normal range of motion. She exhibits no edema or tenderness.  Lymphadenopathy:    She has no cervical adenopathy.  Neurological: She is alert and oriented to person, place, and time. No cranial nerve deficit. She exhibits normal muscle tone.  Skin: Skin is warm and dry. No rash noted. No erythema. No pallor.  Nursing note and vitals reviewed.  ED Treatments / Results  DIAGNOSTIC STUDIES: Oxygen Saturation is 99% on RA, normal by my interpretation.   COORDINATION OF CARE: 12:52 AM-Discussed next steps with pt. Pt verbalized understanding and is agreeable with the plan.   Labs (all labs ordered are listed, but only abnormal results are displayed) Labs Reviewed  LIPASE, BLOOD - Abnormal; Notable for the following:       Result Value   Lipase 101 (*)    All other components within normal limits  COMPREHENSIVE METABOLIC PANEL - Abnormal; Notable for the following:    Potassium 3.2 (*)    Glucose, Bld 104 (*)    Creatinine, Ser 1.28 (*)    Alkaline Phosphatase 37 (*)    GFR calc non Af Amer 60 (*)    All other components within normal limits  CBC - Abnormal; Notable for the following:    WBC 12.2 (*)    All other components within normal limits  URINALYSIS, ROUTINE W REFLEX MICROSCOPIC ([facility]) - Abnormal; Notable for the following:    Color, Urine AMBER (*)    APPearance CLOUDY (*)    Specific Gravity, Urine 1.039 (*)    Glucose, UA 100 (*)    Bilirubin Urine SMALL (*)    Ketones, ur 40 (*)    Protein, ur 30 (*)    All other components within normal limits  URINE MICROSCOPIC-ADD ON - Abnormal; Notable for the following:    Squamous Epithelial / LPF 0-5 (*)    Bacteria, UA RARE (*)    Crystals CA OXALATE CRYSTALS (*)    All other components within normal limits  MAGNESIUM  POC URINE PREG,  ED   EKG  EKG Interpretation  Date/Time:  Saturday March 08 2016 04:47:56 EDT Ventricular Rate:  85 PR Interval:    QRS Duration: 88 QT Interval:  383 QTC Calculation: 456 R Axis:   65 Text Interpretation:  Sinus rhythm Ventricular premature complex No old tracing to compare Confirmed by Erroll LunaOni, Kahdijah Errickson Ayokunle [phone]([id number]) on 03/08/2016 4:56:04 AM      Radiology Koreas Abdomen Complete  Result Date: 03/08/2016 CLINICAL DATA:  Abdominal pain and vomiting for 5 days. Elevated lipase. EXAM: ABDOMEN ULTRASOUND COMPLETE COMPARISON:  None. FINDINGS: Gallbladder: No gallstones or wall thickening visualized. No sonographic Murphy sign noted by sonographer. Common bile duct: Diameter: 2.0 mm Liver: No focal lesion identified. Within normal limits in parenchymal echogenicity. IVC: No abnormality visualized. Pancreas: Visualized portion unremarkable. Spleen: Size and appearance within normal limits. Right Kidney: Length:  10.1 cm. Echogenicity within normal limits. No mass or hydronephrosis visualized. Left Kidney: Length: 10.8 cm. Echogenicity within normal limits. No mass or hydronephrosis visualized. Abdominal aorta: No aneurysm visualized. Other findings: None. IMPRESSION: No significant abnormality Electronically Signed   By: Ellery Plunk M.D.   On: 03/08/2016 03:19    Procedures Procedures   Medications Ordered in ED Medications  morphine 4 MG/ML injection 2-4 mg (not administered)  metoCLOPramide (REGLAN) injection 5 mg (not administered)  ondansetron (ZOFRAN) injection 4 mg (not administered)  sodium chloride 0.9 % bolus 2,000 mL (0 mLs Intravenous Stopped 03/08/16 0252)  ondansetron (ZOFRAN) injection 4 mg (4 mg Intravenous Given 03/08/16 0124)  morphine 4 MG/ML injection 4 mg (4 mg Intravenous Given 03/08/16 0124)  potassium chloride 30 mEq in sodium chloride 0.9 % 265 mL (KCL MULTIRUN) IVPB (30 mEq Intravenous Given 03/08/16 0214)  metoCLOPramide (REGLAN) injection 10 mg (10 mg Intravenous  Given 03/08/16 0350)    Initial Impression / Assessment and Plan / ED Course  I have reviewed the triage vital signs and the nursing notes.  Pertinent labs & imaging results that were available during my care of the patient were reviewed by me and considered in my medical decision making (see chart for details).  Clinical Course   Patient presents to the ED for abdominal pain and vomiting.  Lipase has increased more than her last visit, concerning for pancreatitis.  She was treated with IVF morphine and zofran.  Plan to obtain US that she is suppose to get with her GI doc in 3 days.  Potassium was replaced.   5:57 AM She continues to have nausea despite zofran and reglan administration.  US unremarkable.  Patient does not feel comfortable going home and still can not tolerate orals.  Will admit.  I spoke with Dr. Julian Reil who accepts for observation.  Final Clinical Impressions(s) / ED Diagnoses   Final diagnoses:  Acute pancreatitis, unspecified complication status, unspecified pancreatitis type   New Prescriptions New Prescriptions   No medications on file   I personally performed the services described in this documentation, which was scribed in my presence. The recorded information has been reviewed and is accurate.      Tomasita Crumble, MD 03/08/16 [phone]

## 2016-03-08 NOTE — ED Notes (Signed)
Pt made aware of bed assignment 

## 2016-03-08 NOTE — ED Notes (Addendum)
[facility] spitting into emesis bag, c/o nv, "unable to keep anything down", [facility] Pt alert, NAD, calm, interactive, resps e/u, speaking in clear complete sentences, skin W&D, no dyspnea noted. C/o nv, (denies: fever, chest, abd or back pain, bleeding, diarrhea or constipation), last ate 1200 noon, last BM Wednesday (normal), vomited 4-5x "was clear, then turned yellow", no relief with zofran PTA, have not taken any other GI meds.

## 2016-03-08 NOTE — Progress Notes (Signed)
Patient arrived on unit via stretcher from ED.  Mother at bedside. MD notified of patient's arrival on unit.

## 2016-03-08 NOTE — ED Notes (Signed)
After denying pain since arrival, pt now reports has been having CP since yesterday, states, "did not say anything because she did not think it was related, thought it would go away". Dr. Mora Bellmanni aware.

## 2016-03-08 NOTE — ED Notes (Signed)
Dr. Mora Bellmanni into room, pt drinking fluids (PO challenge), [facility]

## 2016-03-08 NOTE — ED Notes (Signed)
C/o "just constant nausea", denies pain or other sx at this time, VSS[facility]

## 2016-03-08 NOTE — ED Notes (Signed)
Pt not in room, pt in US.  

## 2016-03-08 NOTE — ED Notes (Signed)
Dr. Oni into room. 

## 2016-03-08 NOTE — H&P (Signed)
History and Physical    Debra Thornton ZOX:[id number] DOB: Jan 02, 1996 DOA: 03/08/2016   PCP: Pcp Not In System Chief Complaint:  Chief Complaint  Patient presents with  . Abdominal Pain    HPI: Debra MerleJala Worthey is a 20 y.o. female with medical history significant of Previously healthy other than a bout of colitis in Dec last year.  Patient presents to the ED with 5 day history of intermittent N/V.  Associated epigastric abdominal pain.  No BM since symptoms onset.  She reports her symptoms began after eating spaghetti in her school's cafeteria. Other individuals ate this food w/o presentation of similar symptoms. No noted treatments were tried prior to coming into the ED. Pt was seen in the ED for an evaluation of similar symptoms on 02/17/16 (approximately 20 days ago), and at that time she had a CT A/P and US study performed, both of which were unremarkable. However, lab work revealed elevated LFTs at that time (which she does NOT have today). She denies and alcohol consumption. She denies diarrhea, fever, urgency, frequency, hematuria, dysuria, difficulty urinating, or any other associated symptoms.  Recently seen for GI follow up with [facility] who planned on doing outpatient US abd.  She does admit to cannabis use, none since symptoms started this time around.  Denies any benefit from hot showers or baths.  Also seen previously in ED for similar symptoms 02/17/16, and 12/18/15.  ED Course: US abd negative.  Review of Systems: As per HPI otherwise 10 point review of systems negative.    Past Medical History:  Diagnosis Date  . Colitis     History reviewed. No pertinent surgical history.   reports that she has been smoking Cigarettes.  She has never used smokeless tobacco. She reports that she does not drink alcohol or use drugs.  No Known Allergies  No family history on file. No history of cyclic vomiting syndromes.   Prior to Admission medications   Medication Sig Start Date End  Date Taking? Authorizing Provider  ondansetron (ZOFRAN ODT) 8 MG disintegrating tablet Take 1 tablet (8 mg total) by mouth every 8 (eight) hours as needed for nausea or vomiting. Patient not taking: Reported on 03/08/2016 02/17/16   Azalia BilisKevin Campos, MD  oxyCODONE (ROXICODONE) 5 MG immediate release tablet Take 1 tablet (5 mg total) by mouth every 6 (six) hours as needed for severe pain. Patient not taking: Reported on 03/08/2016 02/17/16   Azalia BilisKevin Campos, MD  potassium chloride SA (K-DUR,KLOR-CON) 20 MEQ tablet Take 1 tablet (20 mEq total) by mouth daily. Patient not taking: Reported on 03/08/2016 02/17/16   Azalia BilisKevin Campos, MD    Physical Exam: Vitals:   03/08/16 0415 03/08/16 0430 03/08/16 0445 03/08/16 0515  BP: 119/84 117/87 136/89 135/98  Pulse:   79 83  Resp:   19 21  Temp:      TempSrc:      SpO2:   100% 100%  Weight:      Height:          Constitutional: NAD, calm, comfortable Eyes: PERRL, lids and conjunctivae normal ENMT: Mucous membranes are moist. Posterior pharynx clear of any exudate or lesions.Normal dentition.  Neck: normal, supple, no masses, no thyromegaly Respiratory: clear to auscultation bilaterally, no wheezing, no crackles. Normal respiratory effort. No accessory muscle use.  Cardiovascular: Regular rate and rhythm, no murmurs / rubs / gallops. No extremity edema. 2+ pedal pulses. No carotid bruits.  Abdomen: no tenderness, no masses palpated. No hepatosplenomegaly. Bowel sounds positive.  Musculoskeletal: no clubbing / cyanosis. No joint deformity upper and lower extremities. Good ROM, no contractures. Normal muscle tone.  Skin: no rashes, lesions, ulcers. No induration Neurologic: CN 2-12 grossly intact. Sensation intact, DTR normal. Strength 5/5 in all 4.  Psychiatric: Normal judgment and insight. Alert and oriented x 3. Normal mood.    Labs on Admission: I have personally reviewed following labs and imaging studies  CBC:  Recent Labs Lab 03/07/16 2216  WBC  12.2*  HGB 12.8  HCT 36.8  MCV 87.4  PLT 337   Basic Metabolic Panel:  Recent Labs Lab 03/07/16 2216  NA 137  K 3.2*  CL 106  CO2 22  GLUCOSE 104*  BUN 8  CREATININE 1.28*  CALCIUM 9.5  MG 2.0   GFR: Estimated Creatinine Clearance: 70.3 mL/min (by C-G formula based on SCr of 1.28 mg/dL (H)). Liver Function Tests:  Recent Labs Lab 03/07/16 2216  AST 21  ALT 17  ALKPHOS 37*  BILITOT 0.9  PROT 7.6  ALBUMIN 3.7    Recent Labs Lab 03/07/16 2216  LIPASE 101*   No results for input(s): AMMONIA in the last 168 hours. Coagulation Profile: No results for input(s): INR, PROTIME in the last 168 hours. Cardiac Enzymes: No results for input(s): CKTOTAL, CKMB, CKMBINDEX, TROPONINI in the last 168 hours. BNP (last 3 results) No results for input(s): PROBNP in the last 8760 hours. HbA1C: No results for input(s): HGBA1C in the last 72 hours. CBG: No results for input(s): GLUCAP in the last 168 hours. Lipid Profile: No results for input(s): CHOL, HDL, LDLCALC, TRIG, CHOLHDL, LDLDIRECT in the last 72 hours. Thyroid Function Tests: No results for input(s): TSH, T4TOTAL, FREET4, T3FREE, THYROIDAB in the last 72 hours. Anemia Panel: No results for input(s): VITAMINB12, FOLATE, FERRITIN, TIBC, IRON, RETICCTPCT in the last 72 hours. Urine analysis:    Component Value Date/Time   COLORURINE AMBER (A) 03/07/2016 2147   APPEARANCEUR CLOUDY (A) 03/07/2016 2147   LABSPEC 1.039 (H) 03/07/2016 2147   PHURINE 7.0 03/07/2016 2147   GLUCOSEU 100 (A) 03/07/2016 2147   HGBUR NEGATIVE 03/07/2016 2147   BILIRUBINUR SMALL (A) 03/07/2016 2147   KETONESUR 40 (A) 03/07/2016 2147   PROTEINUR 30 (A) 03/07/2016 2147   NITRITE NEGATIVE 03/07/2016 2147   LEUKOCYTESUR NEGATIVE 03/07/2016 2147   Sepsis Labs: @LABRCNTIP (procalcitonin:4,lacticidven:4) )No results found for this or any previous visit (from the past 240 hour(s)).   Radiological Exams on Admission: Koreas Abdomen  Complete  Result Date: 03/08/2016 CLINICAL DATA:  Abdominal pain and vomiting for 5 days. Elevated lipase. EXAM: ABDOMEN ULTRASOUND COMPLETE COMPARISON:  None. FINDINGS: Gallbladder: No gallstones or wall thickening visualized. No sonographic Murphy sign noted by sonographer. Common bile duct: Diameter: 2.0 mm Liver: No focal lesion identified. Within normal limits in parenchymal echogenicity. IVC: No abnormality visualized. Pancreas: Visualized portion unremarkable. Spleen: Size and appearance within normal limits. Right Kidney: Length: 10.1 cm. Echogenicity within normal limits. No mass or hydronephrosis visualized. Left Kidney: Length: 10.8 cm. Echogenicity within normal limits. No mass or hydronephrosis visualized. Abdominal aorta: No aneurysm visualized. Other findings: None. IMPRESSION: No significant abnormality Electronically Signed   By: Ellery Plunkaniel R Mitchell M.D.   On: 03/08/2016 03:19    EKG: Independently reviewed.  Assessment/Plan Principal Problem:   Nausea and vomiting    1. Nausea and vomiting - slightly elevated lipase, US abd neg.  DDx includes cannabis induced CVS, vs very mild recurrent pancreatitis, vs other.  Very mild Lipase elevation today which she hasnt had  in the past.  Of note her LFTs today are normal (LFTs were elevated in mid October). 1. Scheduled reglan 2. zofran 3. Morphine PRN 4. Call [facility] in AM 5. Not entirely clear that this is cannabis induced CVS, but did discuss / educate patient on what this syndrome is and suggested if all other work up was negative that she at least trial quitting cannabis   DVT prophylaxis: Lovenox Code Status: Full Family Communication: Mother at bedside Consults called: None Admission status: Admit to obs   GARDNER, Heywood Iles DO [facility] Pager [phone] from 7PM-7AM  If 7AM-7PM, please contact the day physician for the patient [email or web address] Password [email or web address]  03/08/2016, 6:02 AM

## 2016-03-08 NOTE — ED Notes (Addendum)
Pt confusing pain and nausea when asked about the presence of either. Confirmed specifically pt denies any pain at this time. Reports nausea as the same moderate.

## 2016-03-09 ENCOUNTER — Observation Stay (HOSPITAL_COMMUNITY): Payer: BLUE CROSS/BLUE SHIELD

## 2016-03-09 DIAGNOSIS — Z23 Encounter for immunization: Secondary | ICD-10-CM | POA: Diagnosis not present

## 2016-03-09 DIAGNOSIS — G43A1 Cyclical vomiting, intractable: Secondary | ICD-10-CM | POA: Diagnosis not present

## 2016-03-09 DIAGNOSIS — N179 Acute kidney failure, unspecified: Secondary | ICD-10-CM | POA: Diagnosis not present

## 2016-03-09 DIAGNOSIS — E876 Hypokalemia: Secondary | ICD-10-CM | POA: Diagnosis not present

## 2016-03-09 DIAGNOSIS — E860 Dehydration: Secondary | ICD-10-CM | POA: Diagnosis not present

## 2016-03-09 DIAGNOSIS — Z79899 Other long term (current) drug therapy: Secondary | ICD-10-CM | POA: Diagnosis not present

## 2016-03-09 DIAGNOSIS — F17210 Nicotine dependence, cigarettes, uncomplicated: Secondary | ICD-10-CM | POA: Diagnosis not present

## 2016-03-09 DIAGNOSIS — K859 Acute pancreatitis without necrosis or infection, unspecified: Secondary | ICD-10-CM | POA: Diagnosis not present

## 2016-03-09 DIAGNOSIS — R079 Chest pain, unspecified: Secondary | ICD-10-CM | POA: Diagnosis not present

## 2016-03-09 DIAGNOSIS — R109 Unspecified abdominal pain: Secondary | ICD-10-CM | POA: Diagnosis not present

## 2016-03-09 LAB — BASIC METABOLIC PANEL
Anion gap: 9 (ref 5–15)
BUN: <5 mg/dL — ABNORMAL LOW (ref 6–20)
CALCIUM: 8.6 mg/dL — AB (ref 8.9–10.3)
CO2: 19 mmol/L — AB (ref 22–32)
CREATININE: 0.66 mg/dL (ref 0.44–1.00)
Chloride: 105 mmol/L (ref 101–111)
GFR calc Af Amer: >60 mL/min (ref >60)
GFR calc non Af Amer: >60 mL/min (ref >60)
GLUCOSE: 87 mg/dL (ref 65–99)
Potassium: 3 mmol/L — ABNORMAL LOW (ref 3.5–5.1)
Sodium: 133 mmol/L — ABNORMAL LOW (ref 135–145)

## 2016-03-09 LAB — CBC
HEMATOCRIT: 34 % — AB (ref 36.0–46.0)
Hemoglobin: 11.9 g/dL — ABNORMAL LOW (ref 12.0–15.0)
MCH: 30.4 pg (ref 26.0–34.0)
MCHC: 35 g/dL (ref 30.0–36.0)
MCV: 87 fL (ref 78.0–100.0)
Platelets: 251 10*3/uL (ref 150–400)
RBC: 3.91 MIL/uL (ref 3.87–5.11)
RDW: 12.4 % (ref 11.5–15.5)
WBC: 9.6 10*3/uL (ref 4.0–10.5)

## 2016-03-09 MED ORDER — POTASSIUM CHLORIDE CRYS ER 20 MEQ PO TBCR
40.0000 meq | EXTENDED_RELEASE_TABLET | ORAL | Status: AC
  Administered 2016-03-09 (×2): 40 meq via ORAL
  Filled 2016-03-09 (×2): qty 2

## 2016-03-09 MED ORDER — LORAZEPAM 2 MG/ML IJ SOLN
1.0000 mg | Freq: Two times a day (BID) | INTRAMUSCULAR | Status: AC
  Administered 2016-03-09 (×2): 1 mg via INTRAVENOUS
  Filled 2016-03-09 (×2): qty 1

## 2016-03-09 NOTE — Progress Notes (Signed)
PROGRESS NOTE    Debra MerleJala Deming  ZOX:[id number] DOB: Aug 25, 1995 DOA: 03/08/2016 PCP: Levon HedgerEBBIE SCHOENHOFF, MD   Brief Narrative: Debra Thornton is a 20 y.o. female with a history of colitis in the past. She presents with recurrent emesis.   Assessment & Plan:   Principal Problem:   Nausea and vomiting   Nausea and vomiting Possible this could be secondary to mild pancreatitis. No evidence of infection. LFTs within normal limits. This has been a chronically intermittent issue since three months ago. Currently being followed by GI as an outpatient. -continue metoclopramide -continue zofran -will trial ativan -GI recommended outpatient follow-up. May need to call again for EGD  Hypokalemia -replete   DVT prophylaxis: Lovenox subq Code Status: Full code Family Communication: Mom and dad at bedside Disposition Plan: Anticipate discharge home in 1-2 days as she is still not able to tolerate PO intake   Consultants:   [facility]  Procedures:  None  Antimicrobials:  None    Subjective: Patient reports continued nausea  Objective: Vitals:   03/08/16 1733 03/08/16 2042 03/09/16 0457 03/09/16 0858  BP: 119/72 (!) 140/93 (!) 142/79 120/72  Pulse: 75 65 75 75  Resp: 18 18 18 16   Temp: 98.7 F (37.1 C) 99.6 F (37.6 C) 99.1 F (37.3 C) 99.5 F (37.5 C)  TempSrc: Oral Oral Oral Oral  SpO2: 100% 100% 100% 100%  Weight:  78.7 kg (173 lb 8 oz)    Height:        Intake/Output Summary (Last 24 hours) at 03/09/16 1349 Last data filed at 03/09/16 1153  Gross per 24 hour  Intake             3360 ml  Output              850 ml  Net             2510 ml   Filed Weights   03/07/16 2139 03/08/16 0732 03/08/16 2042  Weight: 76.7 kg (169 lb) 78.7 kg (173 lb 8 oz) 78.7 kg (173 lb 8 oz)    Examination:  General exam: Appears calm and comfortable Respiratory system: Clear to auscultation. Respiratory effort normal. Cardiovascular system: S1 & S2 heard, RRR. No murmurs, rubs,  gallops or clicks. Gastrointestinal system: Abdomen is nondistended, soft and nontender. No organomegaly or masses felt. Normal bowel sounds heard. Central nervous system: Alert and oriented. No focal neurological deficits. Extremities: No edema. No calf tenderness Skin: No cyanosis. No rashes Psychiatry: Judgement and insight appear normal. Mood & affect appropriate.     Data Reviewed: I have personally reviewed following labs and imaging studies  CBC:  Recent Labs Lab 03/07/16 2216 03/09/16 0714  WBC 12.2* 9.6  HGB 12.8 11.9*  HCT 36.8 34.0*  MCV 87.4 87.0  PLT 337 251   Basic Metabolic Panel:  Recent Labs Lab 03/07/16 2216 03/09/16 0714  NA 137 133*  K 3.2* 3.0*  CL 106 105  CO2 22 19*  GLUCOSE 104* 87  BUN 8 <5*  CREATININE 1.28* 0.66  CALCIUM 9.5 8.6*  MG 2.0  --    GFR: Estimated Creatinine Clearance: 113.9 mL/min (by C-G formula based on SCr of 0.66 mg/dL). Liver Function Tests:  Recent Labs Lab 03/07/16 2216  AST 21  ALT 17  ALKPHOS 37*  BILITOT 0.9  PROT 7.6  ALBUMIN 3.7    Recent Labs Lab 03/07/16 2216  LIPASE 101*   No results for input(s): AMMONIA in the last 168 hours. Coagulation  Profile: No results for input(s): INR, PROTIME in the last 168 hours. Cardiac Enzymes: No results for input(s): CKTOTAL, CKMB, CKMBINDEX, TROPONINI in the last 168 hours. BNP (last 3 results) No results for input(s): PROBNP in the last 8760 hours. HbA1C: No results for input(s): HGBA1C in the last 72 hours. CBG: No results for input(s): GLUCAP in the last 168 hours. Lipid Profile: No results for input(s): CHOL, HDL, LDLCALC, TRIG, CHOLHDL, LDLDIRECT in the last 72 hours. Thyroid Function Tests: No results for input(s): TSH, T4TOTAL, FREET4, T3FREE, THYROIDAB in the last 72 hours. Anemia Panel: No results for input(s): VITAMINB12, FOLATE, FERRITIN, TIBC, IRON, RETICCTPCT in the last 72 hours. Sepsis Labs: No results for input(s): PROCALCITON,  LATICACIDVEN in the last 168 hours.  No results found for this or any previous visit (from the past 240 hour(s)).       Radiology Studies: US Abdomen Complete  Result Date: 03/08/2016 CLINICAL DATA:  Abdominal pain and vomiting for 5 days. Elevated lipase. EXAM: ABDOMEN ULTRASOUND COMPLETE COMPARISON:  None. FINDINGS: Gallbladder: No gallstones or wall thickening visualized. No sonographic Murphy sign noted by sonographer. Common bile duct: Diameter: 2.0 mm Liver: No focal lesion identified. Within normal limits in parenchymal echogenicity. IVC: No abnormality visualized. Pancreas: Visualized portion unremarkable. Spleen: Size and appearance within normal limits. Right Kidney: Length: 10.1 cm. Echogenicity within normal limits. No mass or hydronephrosis visualized. Left Kidney: Length: 10.8 cm. Echogenicity within normal limits. No mass or hydronephrosis visualized. Abdominal aorta: No aneurysm visualized. Other findings: None. IMPRESSION: No significant abnormality Electronically Signed   By: Ellery Plunk M.D.   On: 03/08/2016 03:19        Scheduled Meds: . enoxaparin (LOVENOX) injection  40 mg Subcutaneous Q24H  . Influenza vac split quadrivalent PF  0.5 mL Intramuscular Tomorrow-1000  . LORazepam  1 mg Intravenous BID  . metoCLOPramide (REGLAN) injection  5 mg Intravenous Q8H  . potassium chloride  40 mEq Oral Q4H   Continuous Infusions: . sodium chloride 125 mL/hr at 03/09/16 0458     LOS: 0 days     Jacquelin Hawking [facility] 03/09/2016, 1:49 PM Pager: [phone]  If 7PM-7AM, please contact night-coverage [email or web address] Password [email or web address] 03/09/2016, 1:49 PM

## 2016-03-10 ENCOUNTER — Other Ambulatory Visit: Payer: BLUE CROSS/BLUE SHIELD

## 2016-03-10 DIAGNOSIS — N179 Acute kidney failure, unspecified: Secondary | ICD-10-CM

## 2016-03-10 DIAGNOSIS — G43A1 Cyclical vomiting, intractable: Secondary | ICD-10-CM | POA: Diagnosis not present

## 2016-03-10 DIAGNOSIS — E876 Hypokalemia: Secondary | ICD-10-CM

## 2016-03-10 LAB — BASIC METABOLIC PANEL
ANION GAP: 7 (ref 5–15)
BUN: <5 mg/dL — ABNORMAL LOW (ref 6–20)
CHLORIDE: 109 mmol/L (ref 101–111)
CO2: 20 mmol/L — AB (ref 22–32)
Calcium: 8.4 mg/dL — ABNORMAL LOW (ref 8.9–10.3)
Creatinine, Ser: 0.8 mg/dL (ref 0.44–1.00)
GFR calc non Af Amer: >60 mL/min (ref >60)
Glucose, Bld: 78 mg/dL (ref 65–99)
POTASSIUM: 3.7 mmol/L (ref 3.5–5.1)
Sodium: 136 mmol/L (ref 135–145)

## 2016-03-10 LAB — HEMOGLOBIN A1C
HEMOGLOBIN A1C: 4.8 % (ref 4.8–5.6)
MEAN PLASMA GLUCOSE: 91 mg/dL

## 2016-03-10 MED ORDER — LORAZEPAM 1 MG PO TABS
1.0000 mg | ORAL_TABLET | Freq: Two times a day (BID) | ORAL | 0 refills | Status: DC | PRN
Start: 2016-03-10 — End: 2017-11-02

## 2016-03-10 MED ORDER — ONDANSETRON 8 MG PO TBDP: 8.0000 mg | ORAL_TABLET | Freq: Three times a day (TID) | ORAL | 0 refills | Status: DC | PRN

## 2016-03-10 MED ORDER — LORAZEPAM 1 MG PO TABS: 1.0000 mg | ORAL_TABLET | Freq: Two times a day (BID) | ORAL | 0 refills | Status: DC | PRN

## 2016-03-10 MED FILL — LORazepam 1 MG TABS: 1 | 7 days supply | Qty: 15 | Fill #0

## 2016-03-10 MED FILL — ONDANSETRON ODT 8 MG TABLET: 8 | 5 days supply | Qty: 15 | Fill #0

## 2016-03-10 NOTE — Progress Notes (Signed)
Discharge instructions and medications discussed with patient.  Prescriptions given to patient.  Note for hospital stay given to patient.  All questions answered.

## 2016-03-10 NOTE — Discharge Summary (Signed)
Physician Discharge Summary  Debra MerleJala Matzke ZOX:[id number] DOB: [birth year] DOA: 03/08/2016  PCP: Levon HedgerEBBIE SCHOENHOFF, MD  Admit date: 03/08/2016 Discharge date: 03/10/2016  Admitted From: Home Disposition:  Home  Recommendations for Outpatient Follow-up:  1. Follow up with PCP in 1 week 2. Follow-up with [facility] in 1 week for continued GI workup  Discharge Condition: Stable CODE STATUS: Full code Diet recommendation: Bland diet, advance slowly  Brief/Interim Summary:  HPI written by Dr. Lyda PeroneJared Gardner on 03/08/2016  HPI: Debra Thornton is a 20 y.o. female with medical history significant of Previously healthy other than a bout of colitis in Dec last year.  Patient presents to the ED with 5 day history of intermittent N/V.  Associated epigastric abdominal pain.  No BM since symptoms onset.  She reports her symptoms began after eating spaghetti in her school's cafeteria. Other individuals ate this food w/o presentation of similar symptoms. No noted treatments were tried prior to coming into the ED. Pt was seen in the ED for an evaluation of similar symptoms on 02/17/16 (approximately 20 days ago), and at that time she had a CT A/P and US study performed, both of which were unremarkable. However, lab work revealed elevated LFTs at that time (which she does NOT have today). She denies and alcohol consumption. She denies diarrhea, fever,urgency, frequency, hematuria, dysuria, difficulty urinating, or any other associated symptoms.  Recently seen for GI follow up with [facility] who planned on doing outpatient US abd.  She does admit to cannabis use, none since symptoms started this time around.  Denies any benefit from hot showers or baths.  Also seen previously in ED for similar symptoms 02/17/16, and 12/18/15.  ED Course: US abd negative.   Hospital course:  Nausea and vomiting Patient's lipase elevated to 101. Thought this could have been a mild pancreatitis. Patient treated with Reglan and  Zofran. She remained symptomatic. Ativan scheduled was started which appeared to resolve symptoms. Possibly this could be secondary to pancreatitis vs cyclic vomiting syndrome. Cannabis induced hyperemesis considered, however, patient is not a chronic smoker. GI consulted and recommended outpatient follow-up  Hypokalemia Secondary to vomiting. Improved with repleation  Acute kidney injury Secondary to dehydration. Creatinine of 1.28 on admission. Improved with IVF and cessation of vomiting.  Discharge Diagnoses:  Principal Problem:   Nausea and vomiting Active Problems:   Hypokalemia   Acute kidney injury [facility](HCC)    Discharge Instructions     Medication List    STOP taking these medications   oxyCODONE 5 MG immediate release tablet Commonly known as:  ROXICODONE   potassium chloride SA 20 MEQ tablet Commonly known as:  K-DUR,KLOR-CON     TAKE these medications   LORazepam 1 MG tablet Commonly known as:  ATIVAN Take 1 tablet (1 mg total) by mouth 2 (two) times daily as needed (Vomiting).   ondansetron 8 MG disintegrating tablet Commonly known as:  ZOFRAN ODT Take 1 tablet (8 mg total) by mouth every 8 (eight) hours as needed for nausea or vomiting.      Follow-up Information    DEBBIE SCHOENHOFF, MD. Schedule an appointment as soon as possible for a visit in 1 week(s).   Specialty:  Internal Medicine Contact information: [address] [place] [address] [phone]        [facility]. Schedule an appointment as soon as possible for a visit in 1 week(s).   Contact information: [address] [place] [address] [phone]  No Known Allergies  Consultations:  [facility]   Procedures/Studies: US Abdomen Complete  Result Date: 03/08/2016 CLINICAL DATA:  Abdominal pain and vomiting for 5 days. Elevated lipase. EXAM: ABDOMEN ULTRASOUND COMPLETE COMPARISON:  None. FINDINGS: Gallbladder: No gallstones or wall  thickening visualized. No sonographic Murphy sign noted by sonographer. Common bile duct: Diameter: 2.0 mm Liver: No focal lesion identified. Within normal limits in parenchymal echogenicity. IVC: No abnormality visualized. Pancreas: Visualized portion unremarkable. Spleen: Size and appearance within normal limits. Right Kidney: Length: 10.1 cm. Echogenicity within normal limits. No mass or hydronephrosis visualized. Left Kidney: Length: 10.8 cm. Echogenicity within normal limits. No mass or hydronephrosis visualized. Abdominal aorta: No aneurysm visualized. Other findings: None. IMPRESSION: No significant abnormality Electronically Signed   By: Ellery Plunk M.D.   On: 03/08/2016 03:19   Ct Abdomen Pelvis W Contrast  Result Date: 02/17/2016 CLINICAL DATA:  Upper abdominal pain.  Lipase of 53. EXAM: CT ABDOMEN AND PELVIS WITH CONTRAST TECHNIQUE: Multidetector CT imaging of the abdomen and pelvis was performed using the standard protocol following bolus administration of intravenous contrast. CONTRAST:  100 cc Isovue 300 intravenous COMPARISON:  12/17/2015 FINDINGS: Lower chest:  No contributory findings. Hepatobiliary: Geographic left liver steatosis or perfusion anomaly. No significant hepatic finding. No evidence of biliary obstruction or stone. Pancreas: No correlate for lipase. The pancreas and peripancreatic region has a stable appearance. Spleen: Unremarkable. Adrenals/Urinary Tract: Negative adrenals. Symmetric renal enhancement. No hydronephrosis. Detection of renal calculi is limited by excretion of contrast. Unremarkable bladder. Stomach/Bowel:  No obstruction. No appendicitis. Vascular/Lymphatic: No acute vascular abnormality. No mass or adenopathy. Reproductive:Symmetric normalized size of the left ovary. Heterogeneous perfusion of the uterus, hypo enhancing on the left is considered incidental/transient given normal appearance previously. Other: No ascites or pneumoperitoneum. Musculoskeletal:  No acute abnormalities. IMPRESSION: 1. No acute finding.  No imaging correlate for lipase elevation. 2. Interval normalization of the left ovary. Electronically Signed   By: Marnee Spring M.D.   On: 02/17/2016 18:15   Dg Abd Portable 1v  Result Date: 03/09/2016 CLINICAL DATA:  Pt presented on Friday with N/V and abdominal pain. Unsure of cause, hx colitis in Dec. Pt having decreased bowel sounds. EXAM: PORTABLE ABDOMEN - 1 VIEW COMPARISON:  CT of 02/17/2016. FINDINGS: Supine view abdomen and pelvis. Non-obstructive bowel gas pattern. Distal gas and stool. No abnormal abdominal calcifications. No appendicolith. No free intraperitoneal air. IMPRESSION: No acute findings. Electronically Signed   By: Jeronimo Greaves M.D.   On: 03/09/2016 14:31      Subjective: Patient reports resolution of symptoms. She feels ativan helped a lot.  Discharge Exam: Vitals:   03/10/16 0529 03/10/16 0908  BP: (!) 92/54 111/66  Pulse: 73 66  Resp: 18 18  Temp: 98.2 F (36.8 C) 98.5 F (36.9 C)   Vitals:   03/09/16 1821 03/09/16 2144 03/10/16 0529 03/10/16 0908  BP: 115/67 116/73 (!) 92/54 111/66  Pulse: 95 86 73 66  Resp: 16 17 18 18   Temp: 98.7 F (37.1 C) 99 F (37.2 C) 98.2 F (36.8 C) 98.5 F (36.9 C)  TempSrc: Oral Oral Oral Oral  SpO2: 100% 100% 100% 100%  Weight:  79.2 kg (174 lb 9.7 oz)    Height:        General: Pt is alert, awake, not in acute distress Cardiovascular: RRR, S1/S2 +, no rubs, no gallops Respiratory: CTA bilaterally, no wheezing, no rhonchi Abdominal: Soft, NT, ND, bowel sounds + Extremities: no edema, no cyanosis  The results of significant diagnostics from this hospitalization (including imaging, microbiology, ancillary and laboratory) are listed below for reference.     Microbiology: No results found for this or any previous visit (from the past 240 hour(s)).   Labs: Basic Metabolic Panel:  Recent Labs Lab 03/07/16 2216 03/09/16 0714 03/10/16 0722  NA 137  133* 136  K 3.2* 3.0* 3.7  CL 106 105 109  CO2 22 19* 20*  GLUCOSE 104* 87 78  BUN 8 <5* <5*  CREATININE 1.28* 0.66 0.80  CALCIUM 9.5 8.6* 8.4*  MG 2.0  --   --    Liver Function Tests:  Recent Labs Lab 03/07/16 2216  AST 21  ALT 17  ALKPHOS 37*  BILITOT 0.9  PROT 7.6  ALBUMIN 3.7    Recent Labs Lab 03/07/16 2216  LIPASE 101*   CBC:  Recent Labs Lab 03/07/16 2216 03/09/16 0714  WBC 12.2* 9.6  HGB 12.8 11.9*  HCT 36.8 34.0*  MCV 87.4 87.0  PLT 337 251   Hgb A1c  Recent Labs  03/09/16 1120  HGBA1C 4.8   Urinalysis    Component Value Date/Time   COLORURINE AMBER (A) 03/07/2016 2147   APPEARANCEUR CLOUDY (A) 03/07/2016 2147   LABSPEC 1.039 (H) 03/07/2016 2147   PHURINE 7.0 03/07/2016 2147   GLUCOSEU 100 (A) 03/07/2016 2147   HGBUR NEGATIVE 03/07/2016 2147   BILIRUBINUR SMALL (A) 03/07/2016 2147   KETONESUR 40 (A) 03/07/2016 2147   PROTEINUR 30 (A) 03/07/2016 2147   NITRITE NEGATIVE 03/07/2016 2147   LEUKOCYTESUR NEGATIVE 03/07/2016 2147     Time coordinating discharge: Over 30 minutes  SIGNED:   Jacquelin Hawkingalph Madisen Ludvigsen, MD [facility] 03/10/2016, 2:26 PM Pager [phone]  If 7PM-7AM, please contact night-coverage [email or web address] Password [email or web address]

## 2016-03-26 DIAGNOSIS — R112 Nausea with vomiting, unspecified: Secondary | ICD-10-CM | POA: Diagnosis not present

## 2016-03-26 DIAGNOSIS — R740 Nonspecific elevation of levels of transaminase and lactic acid dehydrogenase [LDH]: Secondary | ICD-10-CM | POA: Diagnosis not present

## 2016-03-31 MED FILL — XULANE PATCH: 150-35 | 28 days supply | Qty: 3 | Fill #2

## 2016-04-23 DIAGNOSIS — N83202 Unspecified ovarian cyst, left side: Secondary | ICD-10-CM | POA: Diagnosis not present

## 2016-04-23 MED FILL — XULANE PATCH: 150-35 | 28 days supply | Qty: 3 | Fill #3

## 2016-04-30 ENCOUNTER — Ambulatory Visit (HOSPITAL_COMMUNITY)
Admission: EM | Admit: 2016-04-30 | Discharge: 2016-04-30 | Disposition: A | Discharge status: Home / Self Care | Payer: BLUE CROSS/BLUE SHIELD | Attending: Family Medicine | Admitting: Family Medicine

## 2016-04-30 ENCOUNTER — Encounter (HOSPITAL_COMMUNITY): Payer: Self-pay | Admitting: Emergency Medicine

## 2016-04-30 DIAGNOSIS — G43A0 Cyclical vomiting, not intractable: Secondary | ICD-10-CM

## 2016-04-30 DIAGNOSIS — R1115 Cyclical vomiting syndrome unrelated to migraine: Secondary | ICD-10-CM

## 2016-04-30 MED ORDER — ONDANSETRON HCL 4 MG PO TABS: 4.0000 mg | ORAL_TABLET | Freq: Four times a day (QID) | ORAL | 1 refills | Status: DC

## 2016-04-30 NOTE — Discharge Instructions (Signed)
See your doctor as planned. °

## 2016-04-30 NOTE — ED Triage Notes (Signed)
Nausea and vomiting since last night, no abdominal pain

## 2016-04-30 NOTE — ED Provider Notes (Signed)
[facility]    CSN: [id number] Arrival date & time: 04/30/16  1533     History   Chief Complaint Chief Complaint  Patient presents with  . Emesis    HPI Debra Thornton is a 20 y.o. female.   The history is provided by the patient and a parent.  Emesis  Severity:  Moderate Duration:  6 months Quality:  Bright red blood Progression:  Unchanged (has been a chronic problem followed by dr p. hung -gastrowith endoscopy planned for next week. has run out of antiemetic) Chronicity:  Chronic Associated symptoms: no abdominal pain and no diarrhea   Risk factors: not pregnant   Risk factors comment:  Poss sickle cell related.   Past Medical History:  Diagnosis Date  . Colitis     Patient Active Problem List   Diagnosis Date Noted  . Hypokalemia 03/10/2016  . Acute kidney injury (HCC) 03/10/2016  . Nausea and vomiting 03/08/2016    History reviewed. No pertinent surgical history.  OB History    No data available       Home Medications    Prior to Admission medications   Medication Sig Start Date End Date Taking? Authorizing Provider  norelgestromin-ethinyl estradiol (ORTHO EVRA) 150-35 MCG/24HR transdermal patch Place 1 patch onto the skin once a week.   Yes Historical Provider, MD  LORazepam (ATIVAN) 1 MG tablet Take 1 tablet (1 mg total) by mouth 2 (two) times daily as needed (Vomiting). 03/10/16   Narda Bondsalph A Nettey, MD  ondansetron (ZOFRAN ODT) 8 MG disintegrating tablet Take 1 tablet (8 mg total) by mouth every 8 (eight) hours as needed for nausea or vomiting. 03/10/16   Narda Bondsalph A Nettey, MD    Family History No family history on file.  Social History Social History  Substance Use Topics  . Smoking status: Former Smoker    Types: Cigarettes  . Smokeless tobacco: Never Used  . Alcohol use No     Allergies   Patient has no known allergies.   Review of Systems Review of Systems  Constitutional: Negative.   Gastrointestinal: Positive for vomiting.  Negative for abdominal pain and diarrhea.  All other systems reviewed and are negative.    Physical Exam Triage Vital Signs ED Triage Vitals [04/30/16 1659]  Enc Vitals Group     BP 125/86     Pulse Rate 62     Resp      Temp 99.5 F (37.5 C)     Temp Source Oral     SpO2 98 %     Weight      Height      Head Circumference      Peak Flow      Pain Score 0     Pain Loc      Pain Edu?      Excl. in GC?    No data found.   Updated Vital Signs BP 125/86 (BP Location: Left Arm)   Pulse 62   Temp 99.5 F (37.5 C) (Oral)   LMP 04/14/2016   SpO2 98%   Visual Acuity Right Eye Distance:   Left Eye Distance:   Bilateral Distance:    Right Eye Near:   Left Eye Near:    Bilateral Near:     Physical Exam  Constitutional: She is oriented to person, place, and time. She appears well-developed and well-nourished. No distress.  Pulmonary/Chest: Effort normal and breath sounds normal.  Abdominal: Soft. Bowel sounds are normal. She exhibits no  distension and no mass. There is no tenderness. There is no rebound and no guarding. No hernia.  Lymphadenopathy:    She has no cervical adenopathy.  Neurological: She is alert and oriented to person, place, and time.  Skin: Skin is warm and dry.  Nursing note and vitals reviewed.    UC Treatments / Results  Labs (all labs ordered are listed, but only abnormal results are displayed) Labs Reviewed - No data to display  EKG  EKG Interpretation None       Radiology No results found.  Procedures Procedures (including critical care time)  Medications Ordered in UC Medications - No data to display   Initial Impression / Assessment and Plan / UC Course  I have reviewed the triage vital signs and the nursing notes.  Pertinent labs & imaging results that were available during my care of the patient were reviewed by me and considered in my medical decision making (see chart for details).  Clinical Course       Final  Clinical Impressions(s) / UC Diagnoses   Final diagnoses:  None    New Prescriptions New Prescriptions   No medications on file     Linna HoffJames D Julius Matus, MD 05/13/16 1011

## 2016-05-02 ENCOUNTER — Encounter (HOSPITAL_COMMUNITY): Payer: Self-pay | Admitting: Emergency Medicine

## 2016-05-02 ENCOUNTER — Emergency Department (HOSPITAL_COMMUNITY)
Admission: EM | Admit: 2016-05-02 | Discharge: 2016-05-02 | Disposition: A | Discharge status: Home / Self Care | Payer: BLUE CROSS/BLUE SHIELD | Attending: Emergency Medicine | Admitting: Emergency Medicine

## 2016-05-02 DIAGNOSIS — E876 Hypokalemia: Secondary | ICD-10-CM

## 2016-05-02 DIAGNOSIS — G43A0 Cyclical vomiting, not intractable: Secondary | ICD-10-CM | POA: Diagnosis not present

## 2016-05-02 DIAGNOSIS — R112 Nausea with vomiting, unspecified: Secondary | ICD-10-CM | POA: Diagnosis not present

## 2016-05-02 DIAGNOSIS — Z79899 Other long term (current) drug therapy: Secondary | ICD-10-CM | POA: Diagnosis not present

## 2016-05-02 DIAGNOSIS — R1013 Epigastric pain: Secondary | ICD-10-CM

## 2016-05-02 DIAGNOSIS — Z87891 Personal history of nicotine dependence: Secondary | ICD-10-CM | POA: Insufficient documentation

## 2016-05-02 DIAGNOSIS — R1115 Cyclical vomiting syndrome unrelated to migraine: Secondary | ICD-10-CM

## 2016-05-02 DIAGNOSIS — R110 Nausea: Secondary | ICD-10-CM | POA: Diagnosis not present

## 2016-05-02 DIAGNOSIS — R1110 Vomiting, unspecified: Secondary | ICD-10-CM | POA: Diagnosis present

## 2016-05-02 LAB — URINALYSIS, ROUTINE W REFLEX MICROSCOPIC
Bilirubin Urine: NEGATIVE
Glucose, UA: NEGATIVE mg/dL
HGB URINE DIPSTICK: NEGATIVE
Ketones, ur: 80 mg/dL — AB
NITRITE: NEGATIVE
PROTEIN: 100 mg/dL — AB
SPECIFIC GRAVITY, URINE: 1.033 — AB (ref 1.005–1.030)
pH: 6 (ref 5.0–8.0)

## 2016-05-02 LAB — POC URINE PREG, ED: Preg Test, Ur: NEGATIVE

## 2016-05-02 LAB — COMPREHENSIVE METABOLIC PANEL
ALK PHOS: 49 U/L (ref 38–126)
ALT: 32 U/L (ref 14–54)
AST: 30 U/L (ref 15–41)
Albumin: 5.6 g/dL — ABNORMAL HIGH (ref 3.5–5.0)
Anion gap: 15 (ref 5–15)
BILIRUBIN TOTAL: 1.9 mg/dL — AB (ref 0.3–1.2)
BUN: 18 mg/dL (ref 6–20)
CO2: 22 mmol/L (ref 22–32)
CREATININE: 0.95 mg/dL (ref 0.44–1.00)
Calcium: 10.4 mg/dL — ABNORMAL HIGH (ref 8.9–10.3)
Chloride: 103 mmol/L (ref 101–111)
GFR calc Af Amer: >60 mL/min (ref >60)
Glucose, Bld: 101 mg/dL — ABNORMAL HIGH (ref 65–99)
Potassium: 2.8 mmol/L — ABNORMAL LOW (ref 3.5–5.1)
Sodium: 140 mmol/L (ref 135–145)
TOTAL PROTEIN: 9.7 g/dL — AB (ref 6.5–8.1)

## 2016-05-02 LAB — CBC WITH DIFFERENTIAL/PLATELET
BASOS ABS: 0 10*3/uL (ref 0.0–0.1)
Basophils Relative: 0 %
EOS ABS: 0 10*3/uL (ref 0.0–0.7)
EOS PCT: 0 %
HCT: 42.9 % (ref 36.0–46.0)
Hemoglobin: 15.7 g/dL — ABNORMAL HIGH (ref 12.0–15.0)
Lymphocytes Relative: 17 %
Lymphs Abs: 2.1 10*3/uL (ref 0.7–4.0)
MCH: 30.8 pg (ref 26.0–34.0)
MCHC: 36.6 g/dL — ABNORMAL HIGH (ref 30.0–36.0)
MCV: 84.1 fL (ref 78.0–100.0)
MONO ABS: 1.1 10*3/uL — AB (ref 0.1–1.0)
Monocytes Relative: 9 %
Neutro Abs: 8.8 10*3/uL — ABNORMAL HIGH (ref 1.7–7.7)
Neutrophils Relative %: 73 %
PLATELETS: 301 10*3/uL (ref 150–400)
RBC: 5.1 MIL/uL (ref 3.87–5.11)
RDW: 12.6 % (ref 11.5–15.5)
WBC: 12 10*3/uL — AB (ref 4.0–10.5)

## 2016-05-02 LAB — LIPASE, BLOOD: LIPASE: 46 U/L (ref 11–51)

## 2016-05-02 LAB — MAGNESIUM: MAGNESIUM: 2.3 mg/dL (ref 1.7–2.4)

## 2016-05-02 MED ORDER — LORAZEPAM 2 MG/ML IJ SOLN
1.0000 mg | Freq: Once | INTRAMUSCULAR | Status: AC
  Administered 2016-05-02: 1 mg via INTRAVENOUS
  Filled 2016-05-02: qty 1

## 2016-05-02 MED ORDER — PROMETHAZINE HCL 25 MG PO TABS: 25.0000 mg | ORAL_TABLET | Freq: Four times a day (QID) | ORAL | 0 refills | Status: DC | PRN

## 2016-05-02 MED ORDER — DIPHENHYDRAMINE HCL 50 MG/ML IJ SOLN
25.0000 mg | Freq: Once | INTRAMUSCULAR | Status: AC
  Administered 2016-05-02: 25 mg via INTRAVENOUS
  Filled 2016-05-02: qty 1

## 2016-05-02 MED ORDER — SODIUM CHLORIDE 0.9 % IV BOLUS (SEPSIS)
1000.0000 mL | Freq: Once | INTRAVENOUS | Status: AC
  Administered 2016-05-02: 1000 mL via INTRAVENOUS

## 2016-05-02 MED ORDER — METOCLOPRAMIDE HCL 5 MG/ML IJ SOLN
10.0000 mg | Freq: Once | INTRAMUSCULAR | Status: AC
  Administered 2016-05-02: 10 mg via INTRAVENOUS
  Filled 2016-05-02: qty 2

## 2016-05-02 MED ORDER — FAMOTIDINE IN NACL 20-0.9 MG/50ML-% IV SOLN
20.0000 mg | Freq: Once | INTRAVENOUS | Status: AC
  Administered 2016-05-02: 20 mg via INTRAVENOUS
  Filled 2016-05-02: qty 50

## 2016-05-02 MED ORDER — PROMETHAZINE HCL 25 MG RE SUPP: 25.0000 mg | Freq: Four times a day (QID) | RECTAL | 0 refills | Status: DC | PRN

## 2016-05-02 MED ORDER — ONDANSETRON HCL 4 MG/2ML IJ SOLN
4.0000 mg | Freq: Once | INTRAMUSCULAR | Status: AC
  Administered 2016-05-02: 4 mg via INTRAVENOUS
  Filled 2016-05-02: qty 2

## 2016-05-02 MED ORDER — POTASSIUM CHLORIDE CRYS ER 20 MEQ PO TBCR
40.0000 meq | EXTENDED_RELEASE_TABLET | Freq: Once | ORAL | Status: AC
  Administered 2016-05-02: 40 meq via ORAL
  Filled 2016-05-02 (×2): qty 2

## 2016-05-02 MED ORDER — LACTATED RINGERS IV BOLUS (SEPSIS)
1000.0000 mL | Freq: Once | INTRAVENOUS | Status: AC
  Administered 2016-05-02: 1000 mL via INTRAVENOUS

## 2016-05-02 MED ORDER — POTASSIUM CHLORIDE ER 10 MEQ PO TBCR: 10.0000 meq | EXTENDED_RELEASE_TABLET | Freq: Every day | ORAL | 0 refills | Status: DC

## 2016-05-02 NOTE — ED Triage Notes (Addendum)
Pt complaint of acute chronic generalized abdominal pain worsening 12/26. Pt reports associated SOB and emesis; denies diarrhea. Pt denies CP.

## 2016-05-02 NOTE — ED Provider Notes (Signed)
[facility] Provider Note   CSN: [id number] Arrival date & time: 05/02/16  1324     History   Chief Complaint Chief Complaint  Patient presents with  . Abdominal Pain    HPI Debra Thornton is a 20 y.o. female.  The history is provided by the patient. No language interpreter was used.  Abdominal Pain     Debra Thornton is a 20 y.o. female who presents to the Emergency Department complaining of vomiting, abdominal pain.  She reports 3 days of upper abdominal pain with multiple episodes of vomiting. She has associated shortness of breath. Last BM was 3 days ago but denies any constipation. No reports of fevers. She is vomiting everything she eats. Emesis is nonbloody. No prior abdominal surgeries. She had prior similar symptoms and was admitted to the hospital for possible pancreatitis. She denies any alcohol use but does smoke marijuana occasionally. Pain is described as burning in nature, 5 out of 10 in severity. Pain is constant since it began. Past Medical History:  Diagnosis Date  . Colitis     Patient Active Problem List   Diagnosis Date Noted  . Hypokalemia 03/10/2016  . Acute kidney injury (HCC) 03/10/2016  . Nausea and vomiting 03/08/2016    History reviewed. No pertinent surgical history.  OB History    No data available       Home Medications    Prior to Admission medications   Medication Sig Start Date End Date Taking? Authorizing Provider  LORazepam (ATIVAN) 1 MG tablet Take 1 tablet (1 mg total) by mouth 2 (two) times daily as needed (Vomiting). 03/10/16   Narda Bondsalph A Nettey, MD  norelgestromin-ethinyl estradiol (ORTHO EVRA) 150-35 MCG/24HR transdermal patch Place 1 patch onto the skin once a week.    Historical Provider, MD  ondansetron (ZOFRAN ODT) 8 MG disintegrating tablet Take 1 tablet (8 mg total) by mouth every 8 (eight) hours as needed for nausea or vomiting. 03/10/16   Narda Bondsalph A Nettey, MD  ondansetron (ZOFRAN) 4 MG tablet Take 1 tablet (4 mg total) by  mouth every 6 (six) hours. Prn n/v. 04/30/16   Linna HoffJames D Kindl, MD  potassium chloride (K-DUR) 10 MEQ tablet Take 1 tablet (10 mEq total) by mouth daily. 05/02/16   Tilden FossaElizabeth Chucky Homes, MD  promethazine (PHENERGAN) 25 MG suppository Place 1 suppository (25 mg total) rectally every 6 (six) hours as needed for nausea or vomiting. 05/02/16   Tilden FossaElizabeth Lama Narayanan, MD  promethazine (PHENERGAN) 25 MG tablet Take 1 tablet (25 mg total) by mouth every 6 (six) hours as needed for nausea or vomiting. 05/02/16   Tilden FossaElizabeth Eban Weick, MD    Family History No family history on file.  Social History Social History  Substance Use Topics  . Smoking status: Former Smoker    Types: Cigarettes  . Smokeless tobacco: Never Used  . Alcohol use No     Allergies   Patient has no known allergies.   Review of Systems Review of Systems  Gastrointestinal: Positive for abdominal pain.  All other systems reviewed and are negative.    Physical Exam Updated Vital Signs BP 102/65   Pulse 72   Temp 98.3 F (36.8 C) (Oral)   Resp 13   Ht 5\' 4"  (1.626 m)   Wt 165 lb (74.8 kg)   LMP 04/14/2016   SpO2 95%   BMI 28.32 kg/m   Physical Exam  Constitutional: She is oriented to person, place, and time. She appears well-developed and well-nourished.  HENT:  Head: Normocephalic and atraumatic.  Cardiovascular: Regular rhythm.   No murmur heard. Tachycardic  Pulmonary/Chest: Effort normal and breath sounds normal. No respiratory distress.  Abdominal: Soft. There is no rebound and no guarding.  Mild diffuse abdominal tenderness  Musculoskeletal: She exhibits no edema or tenderness.  Neurological: She is alert and oriented to person, place, and time.  Skin: Skin is warm and dry.  Psychiatric: She has a normal mood and affect. Her behavior is normal.  Nursing note and vitals reviewed.    ED Treatments / Results  Labs (all labs ordered are listed, but only abnormal results are displayed) Labs Reviewed  COMPREHENSIVE  METABOLIC PANEL - Abnormal; Notable for the following:       Result Value   Potassium 2.8 (*)    Glucose, Bld 101 (*)    Calcium 10.4 (*)    Total Protein 9.7 (*)    Albumin 5.6 (*)    Total Bilirubin 1.9 (*)    All other components within normal limits  CBC WITH DIFFERENTIAL/PLATELET - Abnormal; Notable for the following:    WBC 12.0 (*)    Hemoglobin 15.7 (*)    MCHC 36.6 (*)    Neutro Abs 8.8 (*)    Monocytes Absolute 1.1 (*)    All other components within normal limits  URINALYSIS, ROUTINE W REFLEX MICROSCOPIC - Abnormal; Notable for the following:    Color, Urine AMBER (*)    APPearance HAZY (*)    Specific Gravity, Urine 1.033 (*)    Ketones, ur 80 (*)    Protein, ur 100 (*)    Leukocytes, UA SMALL (*)    Bacteria, UA RARE (*)    Squamous Epithelial / LPF 0-5 (*)    All other components within normal limits  LIPASE, BLOOD  MAGNESIUM  POC URINE PREG, ED    EKG  EKG Interpretation None       Radiology No results found.  Procedures Procedures (including critical care time)  Medications Ordered in ED Medications  sodium chloride 0.9 % bolus 1,000 mL (0 mLs Intravenous Stopped 05/02/16 1549)  famotidine (PEPCID) IVPB 20 mg premix (0 mg Intravenous Stopped 05/02/16 1500)  ondansetron (ZOFRAN) injection 4 mg (4 mg Intravenous Given 05/02/16 1430)  lactated ringers bolus 1,000 mL (0 mLs Intravenous Stopped 05/02/16 1752)  potassium chloride SA (K-DUR,KLOR-CON) CR tablet 40 mEq (40 mEq Oral Given 05/02/16 1606)  metoCLOPramide (REGLAN) injection 10 mg (10 mg Intravenous Given 05/02/16 1607)  diphenhydrAMINE (BENADRYL) injection 25 mg (25 mg Intravenous Given 05/02/16 1607)  LORazepam (ATIVAN) injection 1 mg (1 mg Intravenous Given 05/02/16 1607)     Initial Impression / Assessment and Plan / ED Course  I have reviewed the triage vital signs and the nursing notes.  Pertinent labs & imaging results that were available during my care of the patient were reviewed  by me and considered in my medical decision making (see chart for details).  Clinical Course     Patient with history of cyclical vomiting syndrome, recently diagnosed this year. She is here with recurrent vomiting and abdominal cramping. She has mild upper abdominal tenderness on examination with no peritoneal findings. Her nausea and vomiting have improved in the emergency department following treatment. Current clinical picture is not consistent with bowel obstruction, pancreatitis, appendicitis. Discussed home care with liquid diet, antiemetics, GI follow-up as well as return precautions. Also discussed discontinuing marijuana use.  Final Clinical Impressions(s) / ED Diagnoses   Final diagnoses:  Hypokalemia  Non-intractable cyclical  vomiting with nausea  Epigastric pain    New Prescriptions Discharge Medication List as of 05/02/2016  5:23 PM    START taking these medications   Details  potassium chloride (K-DUR) 10 MEQ tablet Take 1 tablet (10 mEq total) by mouth daily., Starting Fri 05/02/2016, Print    promethazine (PHENERGAN) 25 MG suppository Place 1 suppository (25 mg total) rectally every 6 (six) hours as needed for nausea or vomiting., Starting Fri 05/02/2016, Print         Tilden Fossa, MD 05/03/16 [phone]

## 2016-05-03 ENCOUNTER — Emergency Department (HOSPITAL_COMMUNITY): Payer: BLUE CROSS/BLUE SHIELD

## 2016-05-03 ENCOUNTER — Emergency Department (HOSPITAL_COMMUNITY)
Admission: EM | Admit: 2016-05-03 | Discharge: 2016-05-03 | Disposition: A | Discharge status: Home / Self Care | Payer: BLUE CROSS/BLUE SHIELD | Attending: Emergency Medicine | Admitting: Emergency Medicine

## 2016-05-03 ENCOUNTER — Encounter (HOSPITAL_COMMUNITY): Payer: Self-pay | Admitting: Emergency Medicine

## 2016-05-03 DIAGNOSIS — Z87891 Personal history of nicotine dependence: Secondary | ICD-10-CM | POA: Diagnosis not present

## 2016-05-03 DIAGNOSIS — R112 Nausea with vomiting, unspecified: Secondary | ICD-10-CM | POA: Diagnosis not present

## 2016-05-03 DIAGNOSIS — R1110 Vomiting, unspecified: Secondary | ICD-10-CM | POA: Diagnosis not present

## 2016-05-03 LAB — URINALYSIS, ROUTINE W REFLEX MICROSCOPIC
BILIRUBIN URINE: NEGATIVE
Glucose, UA: NEGATIVE mg/dL
HGB URINE DIPSTICK: NEGATIVE
Ketones, ur: 80 mg/dL — AB
LEUKOCYTES UA: NEGATIVE
Nitrite: NEGATIVE
PH: 7 (ref 5.0–8.0)
Protein, ur: 100 mg/dL — AB
SPECIFIC GRAVITY, URINE: 1.026 (ref 1.005–1.030)
Squamous Epithelial / LPF: NONE SEEN

## 2016-05-03 LAB — CBC WITH DIFFERENTIAL/PLATELET
BASOS ABS: 0 10*3/uL (ref 0.0–0.1)
Basophils Relative: 0 %
EOS ABS: 0 10*3/uL (ref 0.0–0.7)
EOS PCT: 0 %
HCT: 38 % (ref 36.0–46.0)
Hemoglobin: 13.8 g/dL (ref 12.0–15.0)
Lymphocytes Relative: 24 %
Lymphs Abs: 2.5 10*3/uL (ref 0.7–4.0)
MCH: 30.7 pg (ref 26.0–34.0)
MCHC: 36.3 g/dL — ABNORMAL HIGH (ref 30.0–36.0)
MCV: 84.6 fL (ref 78.0–100.0)
Monocytes Absolute: 0.9 10*3/uL (ref 0.1–1.0)
Monocytes Relative: 8 %
Neutro Abs: 7.1 10*3/uL (ref 1.7–7.7)
Neutrophils Relative %: 68 %
PLATELETS: 243 10*3/uL (ref 150–400)
RBC: 4.49 MIL/uL (ref 3.87–5.11)
RDW: 12.3 % (ref 11.5–15.5)
WBC: 10.4 10*3/uL (ref 4.0–10.5)

## 2016-05-03 LAB — COMPREHENSIVE METABOLIC PANEL
ALK PHOS: 41 U/L (ref 38–126)
ALT: 91 U/L — AB (ref 14–54)
AST: 67 U/L — ABNORMAL HIGH (ref 15–41)
Albumin: 4 g/dL (ref 3.5–5.0)
Anion gap: 13 (ref 5–15)
BUN: 10 mg/dL (ref 6–20)
CALCIUM: 9.7 mg/dL (ref 8.9–10.3)
CHLORIDE: 109 mmol/L (ref 101–111)
CO2: 19 mmol/L — ABNORMAL LOW (ref 22–32)
CREATININE: 0.96 mg/dL (ref 0.44–1.00)
Glucose, Bld: 82 mg/dL (ref 65–99)
Potassium: 2.9 mmol/L — ABNORMAL LOW (ref 3.5–5.1)
Sodium: 141 mmol/L (ref 135–145)
TOTAL PROTEIN: 7.7 g/dL (ref 6.5–8.1)
Total Bilirubin: 2 mg/dL — ABNORMAL HIGH (ref 0.3–1.2)

## 2016-05-03 LAB — LIPASE, BLOOD: LIPASE: 46 U/L (ref 11–51)

## 2016-05-03 MED ORDER — POTASSIUM CHLORIDE ER 20 MEQ PO TBCR: 10.0000 meq | EXTENDED_RELEASE_TABLET | Freq: Two times a day (BID) | ORAL | 0 refills | Status: DC

## 2016-05-03 MED ORDER — ONDANSETRON 4 MG PO TBDP: 4.0000 mg | ORAL_TABLET | Freq: Three times a day (TID) | ORAL | 0 refills | Status: DC | PRN

## 2016-05-03 MED ORDER — SODIUM CHLORIDE 0.9 % IV BOLUS (SEPSIS)
1000.0000 mL | Freq: Once | INTRAVENOUS | Status: AC
  Administered 2016-05-03: 1000 mL via INTRAVENOUS

## 2016-05-03 MED ORDER — POTASSIUM CHLORIDE CRYS ER 20 MEQ PO TBCR
40.0000 meq | EXTENDED_RELEASE_TABLET | Freq: Once | ORAL | Status: AC
  Administered 2016-05-03: 40 meq via ORAL
  Filled 2016-05-03: qty 2

## 2016-05-03 MED ORDER — ONDANSETRON HCL 4 MG/2ML IJ SOLN
4.0000 mg | Freq: Once | INTRAMUSCULAR | Status: AC
  Administered 2016-05-03: 4 mg via INTRAVENOUS
  Filled 2016-05-03: qty 2

## 2016-05-03 MED ORDER — ONDANSETRON 4 MG PO TBDP
4.0000 mg | ORAL_TABLET | Freq: Once | ORAL | Status: AC
  Administered 2016-05-03: 4 mg via ORAL
  Filled 2016-05-03: qty 1

## 2016-05-03 NOTE — ED Provider Notes (Signed)
Patient with multiple episodes of emesis since yesterday. She denies abdominal pain. She's had similar episodes multiple times since December 2016. [facility]. She felt better upon discharge. Discharged with prescription for Phenergan and potassium. She is taking the Phenergan however continues to vomit. On exam she is alert and in no distress abdomen normal active bowel sounds, soft nontender   Doug SouSam Hassaan Crite, MD 05/03/16 1819

## 2016-05-03 NOTE — ED Notes (Signed)
Pt unable to provide urine sample at this time.  Fluids running, will try again.

## 2016-05-03 NOTE — ED Triage Notes (Signed)
Pt c/o vomiting x 4 in last 24 hours. Pt has history of same for years and is scheduled to see a specialist on Wednesday. [facility], per visitor medications are ineffective and ativan is what works for pt.

## 2016-05-03 NOTE — ED Notes (Signed)
Patient's father phoned to request PO ativan prescription and that we call this prescription in. He also volunteered to pick up a paper prescription. Informed and verified by Charge nurse Darl PikesSusan that this patient will have to be seen again. Patient's father requested Dr. Madilyn Hookees' direct line or to call her directly. Dr. Madilyn Hookees unavailable.

## 2016-05-03 NOTE — ED Notes (Signed)
Pt stable, ambulatory, states understanding of discharge instructions 

## 2016-05-03 NOTE — ED Provider Notes (Signed)
[facility] Provider Note   CSN: [id number] Arrival date & time: 05/03/16  1343     History   Chief Complaint Chief Complaint  Patient presents with  . Emesis    HPI Debra Thornton is a 20 y.o. female.  The history is provided by the patient, a parent and medical records.  Emesis   This is a recurrent problem. Episode onset: 12/26. Episode frequency: about 5x per day. The problem has not changed since onset.The emesis has an appearance of stomach contents. There has been no fever. Pertinent negatives include no abdominal pain, no chills, no diarrhea, no fever and no headaches.    Past Medical History:  Diagnosis Date  . Colitis     Patient Active Problem List   Diagnosis Date Noted  . Hypokalemia 03/10/2016  . Acute kidney injury (HCC) 03/10/2016  . Nausea and vomiting 03/08/2016    History reviewed. No pertinent surgical history.  OB History    No data available       Home Medications    Prior to Admission medications   Medication Sig Start Date End Date Taking? Authorizing Provider  LORazepam (ATIVAN) 1 MG tablet Take 1 tablet (1 mg total) by mouth 2 (two) times daily as needed (Vomiting). 03/10/16   Narda Bondsalph A Nettey, MD  norelgestromin-ethinyl estradiol (ORTHO EVRA) 150-35 MCG/24HR transdermal patch Place 1 patch onto the skin once a week.    Historical Provider, MD  ondansetron (ZOFRAN ODT) 4 MG disintegrating tablet Take 1 tablet (4 mg total) by mouth every 8 (eight) hours as needed for nausea or vomiting. 05/03/16   Dwana Melenaobin Cyndee Giammarco, DO  potassium chloride 20 MEQ TBCR Take 10 mEq by mouth 2 (two) times daily. 05/03/16 05/06/16  Dwana Melenaobin Angeliyah Kirkey, DO  promethazine (PHENERGAN) 25 MG suppository Place 1 suppository (25 mg total) rectally every 6 (six) hours as needed for nausea or vomiting. 05/02/16   Tilden FossaElizabeth Rees, MD  promethazine (PHENERGAN) 25 MG tablet Take 1 tablet (25 mg total) by mouth every 6 (six) hours as needed for nausea or vomiting. 05/02/16   Tilden FossaElizabeth Rees,  MD    Family History No family history on file.  Social History Social History  Substance Use Topics  . Smoking status: Former Smoker    Types: Cigarettes  . Smokeless tobacco: Never Used  . Alcohol use No     Allergies   Patient has no known allergies.   Review of Systems Review of Systems  Constitutional: Positive for fatigue. Negative for chills and fever.  Respiratory: Negative for shortness of breath.   Cardiovascular: Negative for chest pain.  Gastrointestinal: Positive for nausea and vomiting. Negative for abdominal distention, abdominal pain, blood in stool, constipation and diarrhea.  Genitourinary: Negative for dysuria, flank pain and hematuria.  Skin: Negative for rash.  Neurological: Negative for headaches.  All other systems reviewed and are negative.    Physical Exam Updated Vital Signs BP 114/91   Pulse 87   Temp 100.8 F (38.2 C)   Resp 16   Ht 5\' 4"  (1.626 m)   Wt 74.8 kg   LMP 04/14/2016   SpO2 100%   BMI 28.32 kg/m   Physical Exam  Constitutional: She appears well-developed and well-nourished. No distress.  HENT:  Head: Normocephalic and atraumatic.  Mouth/Throat: Oropharynx is clear and moist.  Eyes: Conjunctivae are normal. No scleral icterus.  Neck: Neck supple.  Cardiovascular: Normal rate and regular rhythm.   No murmur heard. Pulmonary/Chest: Effort normal and breath sounds normal. No  respiratory distress. She has no wheezes. She has no rales.  Abdominal: Soft. She exhibits no distension. There is no tenderness. There is no rebound and no guarding. No hernia.  Musculoskeletal: She exhibits no edema.  Neurological: She is alert.  Skin: Skin is warm and dry.  Psychiatric: She has a normal mood and affect.  Nursing note and vitals reviewed.    ED Treatments / Results  Labs (all labs ordered are listed, but only abnormal results are displayed) Labs Reviewed  COMPREHENSIVE METABOLIC PANEL - Abnormal; Notable for the following:        Result Value   Potassium 2.9 (*)    CO2 19 (*)    AST 67 (*)    ALT 91 (*)    Total Bilirubin 2.0 (*)    All other components within normal limits  CBC WITH DIFFERENTIAL/PLATELET - Abnormal; Notable for the following:    MCHC 36.3 (*)    All other components within normal limits  URINALYSIS, ROUTINE W REFLEX MICROSCOPIC - Abnormal; Notable for the following:    Color, Urine AMBER (*)    APPearance CLOUDY (*)    Ketones, ur 80 (*)    Protein, ur 100 (*)    Bacteria, UA RARE (*)    All other components within normal limits  LIPASE, BLOOD    EKG  EKG Interpretation None       Radiology Dg Abdomen Acute W/chest  Result Date: 05/03/2016 CLINICAL DATA:  Patient with vomiting for the last 24 hours. EXAM: DG ABDOMEN ACUTE W/ 1V CHEST COMPARISON:  Abdominal radiograph 03/09/2016. FINDINGS: Normal cardiac and mediastinal contours. No consolidative pulmonary opacities. No pleural effusion or pneumothorax. Gas is demonstrated within nondilated loops of large and small bowel in a nonobstructed pattern. No evidence for free intraperitoneal air. Osseous skeleton unremarkable. Suggestion of degenerative changes at the pubic symphysis. IMPRESSION: No acute cardiopulmonary process. Nonobstructed bowel gas pattern. Electronically Signed   By: Annia Beltrew  Davis M.D.   On: 05/03/2016 18:36    Procedures Procedures (including critical care time)  Medications Ordered in ED Medications  ondansetron (ZOFRAN) injection 4 mg (4 mg Intravenous Given 05/03/16 1738)  sodium chloride 0.9 % bolus 1,000 mL (0 mLs Intravenous Stopped 05/03/16 1858)  potassium chloride SA (K-DUR,KLOR-CON) CR tablet 40 mEq (40 mEq Oral Given 05/03/16 2039)  ondansetron (ZOFRAN-ODT) disintegrating tablet 4 mg (4 mg Oral Given 05/03/16 2106)     Initial Impression / Assessment and Plan / ED Course  I have reviewed the triage vital signs and the nursing notes.  Pertinent labs & imaging results that were available during my  care of the patient were reviewed by me and considered in my medical decision making (see chart for details).  Clinical Course    Patient is a 20 year old female with history of recurrent episodes of nausea/vomiting over the last year. Patient was told cyclical vomiting syndrome when she was seen earlier this [facility]. Patient was prescribed Phenergan when she was seen yesterday however that has not helped her vomiting. Nausea and vomiting is worse after eating and she has around 5 episodes of vomiting per day over the last 5 days. Patient denies any significant abdominal pain and primarily complains of nausea. No diarrhea. Patient has been afebrile and has no fever here.  Labs reviewed from yesterday's ED visit which shows hypokalemia at 2.8. Patient was given by mouth potassium prior to discharge and was given a prescription for potassium however she has not started taking  that yet. Repeat labs today show potassium 2.9. No leukocytosis. LFTs are mildly elevated. Lipase is normal. Acute abdominal series shows gas within loops of bowel without any signs of obstructive bowel pattern. No free air is noted. UA did not show any signs of infection. On exam patient has no abdominal tenderness at this time. Patient had complete abdominal ultrasound last month that was negative for any gallstones or any other acute findings. Doubt cholelithiasis, cholecystitis, pancreatitis, peptic ulcer disease, appendicitis, bowel obstruction, kidney stone or any other acute intracranial pathology at this time. Symptoms are consistent with cyclical vomiting syndrome. Patient does smoke marijuana however she states she has not smoked since onset of symptoms on 12/26. Patient has low-grade fever however the absence of any leukocytosis or current abdominal pain doubt acute infectious pathology.  Patient is given normal saline bolus and 1 dose of Zofran here. Patient tolerated multiple cups of fluids in the ED. Patient  given 40 mEq of  PO potassium. Patient given dose of Zofran ODT prior to discharge. Patient's discharge with KCl 20 mEq tabs twice a day 3 days. Zofran ODT prescribed for nausea as Zofran worked well for her.   Patient is discharged home in good condition and has a follow-up appointment with gastroenterology on Wednesday. Return precautions given if symptoms acutely worsen and symptoms not controlled with medicines at home.  Pt seen with attending, Dr. Ethelda Chick.    Final Clinical Impressions(s) / ED Diagnoses   Final diagnoses:  Vomiting    New Prescriptions Discharge Medication List as of 05/03/2016  8:55 PM       Dwana Melena, DO 05/04/16 0126    Doug Sou, MD 05/05/16 1610

## 2016-05-03 NOTE — ED Notes (Signed)
Patient transported to X-ray 

## 2016-05-07 ENCOUNTER — Other Ambulatory Visit: Payer: Self-pay | Admitting: Gastroenterology

## 2016-05-07 DIAGNOSIS — R740 Nonspecific elevation of levels of transaminase and lactic acid dehydrogenase [LDH]: Secondary | ICD-10-CM | POA: Diagnosis not present

## 2016-05-07 DIAGNOSIS — R112 Nausea with vomiting, unspecified: Secondary | ICD-10-CM | POA: Diagnosis not present

## 2016-05-07 DIAGNOSIS — R110 Nausea: Secondary | ICD-10-CM

## 2016-05-08 ENCOUNTER — Other Ambulatory Visit: Payer: Self-pay | Admitting: Gastroenterology

## 2016-05-08 DIAGNOSIS — K208 Other esophagitis: Secondary | ICD-10-CM | POA: Diagnosis not present

## 2016-05-08 DIAGNOSIS — R112 Nausea with vomiting, unspecified: Secondary | ICD-10-CM | POA: Diagnosis not present

## 2016-05-08 DIAGNOSIS — R110 Nausea: Secondary | ICD-10-CM

## 2016-05-08 DIAGNOSIS — K209 Esophagitis, unspecified: Secondary | ICD-10-CM | POA: Diagnosis not present

## 2016-05-08 DIAGNOSIS — B3781 Candidal esophagitis: Secondary | ICD-10-CM | POA: Diagnosis not present

## 2016-05-14 MED FILL — FLUCONAZOLE 200 MG TABLET: 200 | 10 days supply | Qty: 11 | Fill #0

## 2016-06-10 DIAGNOSIS — B3781 Candidal esophagitis: Secondary | ICD-10-CM | POA: Diagnosis not present

## 2016-06-10 DIAGNOSIS — R740 Nonspecific elevation of levels of transaminase and lactic acid dehydrogenase [LDH]: Secondary | ICD-10-CM | POA: Diagnosis not present

## 2016-06-10 MED FILL — XULANE PATCH: 150-35 | 28 days supply | Qty: 3 | Fill #4

## 2016-06-28 ENCOUNTER — Encounter (HOSPITAL_BASED_OUTPATIENT_CLINIC_OR_DEPARTMENT_OTHER): Payer: Self-pay | Admitting: *Deleted

## 2016-06-28 ENCOUNTER — Emergency Department (HOSPITAL_BASED_OUTPATIENT_CLINIC_OR_DEPARTMENT_OTHER)
Admission: EM | Admit: 2016-06-28 | Discharge: 2016-06-28 | Disposition: A | Discharge status: Home / Self Care | Payer: BLUE CROSS/BLUE SHIELD | Attending: Emergency Medicine | Admitting: Emergency Medicine

## 2016-06-28 DIAGNOSIS — Z87891 Personal history of nicotine dependence: Secondary | ICD-10-CM | POA: Insufficient documentation

## 2016-06-28 DIAGNOSIS — F1290 Cannabis use, unspecified, uncomplicated: Secondary | ICD-10-CM | POA: Insufficient documentation

## 2016-06-28 DIAGNOSIS — R112 Nausea with vomiting, unspecified: Secondary | ICD-10-CM | POA: Diagnosis not present

## 2016-06-28 LAB — PREGNANCY, URINE: Preg Test, Ur: NEGATIVE

## 2016-06-28 MED ORDER — PROMETHAZINE HCL 25 MG/ML IJ SOLN
12.5000 mg | Freq: Once | INTRAMUSCULAR | Status: AC
  Administered 2016-06-28: 12.5 mg via INTRAVENOUS
  Filled 2016-06-28: qty 1

## 2016-06-28 MED ORDER — PROMETHAZINE HCL 25 MG PO TABS: 25.0000 mg | ORAL_TABLET | Freq: Four times a day (QID) | ORAL | 0 refills | Status: DC | PRN

## 2016-06-28 MED ORDER — SODIUM CHLORIDE 0.9 % IV SOLN
Freq: Once | INTRAVENOUS | Status: AC
  Administered 2016-06-28: 1000 mL via INTRAVENOUS

## 2016-06-28 MED ORDER — ONDANSETRON HCL 4 MG/2ML IJ SOLN
4.0000 mg | Freq: Once | INTRAMUSCULAR | Status: AC
  Administered 2016-06-28: 4 mg via INTRAVENOUS
  Filled 2016-06-28: qty 2

## 2016-06-28 NOTE — ED Triage Notes (Signed)
N/v x 2 days  Vomited a lot   Denies pain

## 2016-06-28 NOTE — ED Provider Notes (Signed)
[facility] Provider Note: Lowella Dell, MD, FACEP  CSN: [id number] MRN: [id number] ARRIVAL: 06/28/16 at 0435 ROOM: [facility]08/[facility]08   CHIEF COMPLAINT  Vomiting   HISTORY OF PRESENT ILLNESS  Debra Thornton is a 21 y.o. female with a two-day history of nausea and vomiting. She has had no associated fever, chills, abdominal pain or cramping, or diarrhea. She has had some sweating with it. Vomiting is exacerbated by attempting DE or drink. She states she has vomited multiple times.   Past Medical History:  Diagnosis Date  . Colitis     History reviewed. No pertinent surgical history.  No family history on file.  Social History  Substance Use Topics  . Smoking status: Former Smoker    Types: Cigarettes  . Smokeless tobacco: Never Used  . Alcohol use No    Prior to Admission medications   Medication Sig Start Date End Date Taking? Authorizing Provider  LORazepam (ATIVAN) 1 MG tablet Take 1 tablet (1 mg total) by mouth 2 (two) times daily as needed (Vomiting). 03/10/16   Narda Bonds, MD  norelgestromin-ethinyl estradiol (ORTHO EVRA) 150-35 MCG/24HR transdermal patch Place 1 patch onto the skin once a week.    Historical Provider, MD  ondansetron (ZOFRAN ODT) 4 MG disintegrating tablet Take 1 tablet (4 mg total) by mouth every 8 (eight) hours as needed for nausea or vomiting. 05/03/16   Dwana Melena, DO  potassium chloride 20 MEQ TBCR Take 10 mEq by mouth 2 (two) times daily. 05/03/16 05/06/16  Dwana Melena, DO  promethazine (PHENERGAN) 25 MG suppository Place 1 suppository (25 mg total) rectally every 6 (six) hours as needed for nausea or vomiting. 05/02/16   Tilden Fossa, MD  promethazine (PHENERGAN) 25 MG tablet Take 1 tablet (25 mg total) by mouth every 6 (six) hours as needed for nausea or vomiting. 05/02/16   Tilden Fossa, MD  promethazine (PHENERGAN) 25 MG tablet Take 1 tablet (25 mg total) by mouth every 6 (six) hours as needed for nausea or vomiting. 06/28/16   Paula Libra, MD    Allergies Patient has no known allergies.   REVIEW OF SYSTEMS  Negative except as noted here or in the History of Present Illness.   PHYSICAL EXAMINATION  Initial Vital Signs Blood pressure 141/96, pulse 79, temperature 99.8 F (37.7 C), temperature source Oral, resp. rate 16, height 5\' 4"  (1.626 m), weight 180 lb (81.6 kg), last menstrual period 06/15/2016, SpO2 100 %.  Examination General: Well-developed, well-nourished female in no acute distress; appearance consistent with age of record HENT: normocephalic; atraumatic Eyes: pupils equal, round and reactive to light; extraocular muscles intact Neck: supple Heart: regular rate and rhythm Lungs: clear to auscultation bilaterally Abdomen: soft; nondistended; nontender; no masses or hepatosplenomegaly; bowel sounds present Extremities: No deformity; full range of motion; pulses normal Neurologic: Awake, alert and oriented; motor function intact in all extremities and symmetric; no facial droop Skin: Warm and dry Psychiatric: Normal mood and affect   RESULTS  Summary of this visit's results, reviewed by myself:   EKG Interpretation  Date/Time:    Ventricular Rate:    PR Interval:    QRS Duration:   QT Interval:    QTC Calculation:   R Axis:     Text Interpretation:        Laboratory Studies: Results for orders placed or performed during the hospital encounter of 06/28/16 (from the past 24 hour(s))  Pregnancy, urine     Status: None   Collection Time:  06/28/16  6:14 AM  Result Value Ref Range   Preg Test, Ur NEGATIVE NEGATIVE   Imaging Studies: No results found.  ED COURSE  Nursing notes and initial vitals signs, including pulse oximetry, reviewed.  Vitals:   06/28/16 0452 06/28/16 0453  BP: 141/96   Pulse: 79   Resp: 16   Temp: 99.8 F (37.7 C)   TempSrc: Oral   SpO2: 100%   Weight:  180 lb (81.6 kg)  Height:  5\' 4"  (1.626 m)   7:12 AM No relief with Zofran. Patient feeling better  after IV fluid bolus and Phenergan.  PROCEDURES    ED DIAGNOSES     ICD-9-CM ICD-10-CM   1. Nausea and vomiting in adult 787.01 R11.2        Paula LibraJohn Jolee Critcher, MD 06/28/16 [phone]

## 2016-06-29 ENCOUNTER — Encounter (HOSPITAL_BASED_OUTPATIENT_CLINIC_OR_DEPARTMENT_OTHER): Payer: Self-pay | Admitting: Emergency Medicine

## 2016-06-29 ENCOUNTER — Emergency Department (HOSPITAL_BASED_OUTPATIENT_CLINIC_OR_DEPARTMENT_OTHER)
Admission: EM | Admit: 2016-06-29 | Discharge: 2016-06-29 | Disposition: A | Discharge status: Home / Self Care | Payer: BLUE CROSS/BLUE SHIELD | Attending: Emergency Medicine | Admitting: Emergency Medicine

## 2016-06-29 DIAGNOSIS — J029 Acute pharyngitis, unspecified: Secondary | ICD-10-CM | POA: Insufficient documentation

## 2016-06-29 DIAGNOSIS — F1290 Cannabis use, unspecified, uncomplicated: Secondary | ICD-10-CM | POA: Insufficient documentation

## 2016-06-29 DIAGNOSIS — E876 Hypokalemia: Secondary | ICD-10-CM | POA: Diagnosis not present

## 2016-06-29 DIAGNOSIS — R112 Nausea with vomiting, unspecified: Secondary | ICD-10-CM

## 2016-06-29 DIAGNOSIS — Z79899 Other long term (current) drug therapy: Secondary | ICD-10-CM | POA: Diagnosis not present

## 2016-06-29 DIAGNOSIS — Z87891 Personal history of nicotine dependence: Secondary | ICD-10-CM | POA: Insufficient documentation

## 2016-06-29 DIAGNOSIS — N3000 Acute cystitis without hematuria: Secondary | ICD-10-CM | POA: Diagnosis not present

## 2016-06-29 HISTORY — DX: Acute pancreatitis without necrosis or infection, unspecified: K85.90

## 2016-06-29 HISTORY — DX: Candidal esophagitis: B37.81

## 2016-06-29 LAB — CBC WITH DIFFERENTIAL/PLATELET
Basophils Absolute: 0 10*3/uL (ref 0.0–0.1)
Basophils Relative: 0 %
EOS ABS: 0 10*3/uL (ref 0.0–0.7)
Eosinophils Relative: 0 %
HCT: 37.8 % (ref 36.0–46.0)
HEMOGLOBIN: 13.3 g/dL (ref 12.0–15.0)
LYMPHS ABS: 2.1 10*3/uL (ref 0.7–4.0)
LYMPHS PCT: 22 %
MCH: 30.6 pg (ref 26.0–34.0)
MCHC: 35.2 g/dL (ref 30.0–36.0)
MCV: 86.9 fL (ref 78.0–100.0)
Monocytes Absolute: 1 10*3/uL (ref 0.1–1.0)
Monocytes Relative: 11 %
NEUTROS PCT: 67 %
Neutro Abs: 6.3 10*3/uL (ref 1.7–7.7)
Platelets: 246 10*3/uL (ref 150–400)
RBC: 4.35 MIL/uL (ref 3.87–5.11)
RDW: 12.3 % (ref 11.5–15.5)
WBC: 9.4 10*3/uL (ref 4.0–10.5)

## 2016-06-29 LAB — COMPREHENSIVE METABOLIC PANEL
ALK PHOS: 44 U/L (ref 38–126)
ALT: 63 U/L — AB (ref 14–54)
ANION GAP: 9 (ref 5–15)
AST: 46 U/L — ABNORMAL HIGH (ref 15–41)
Albumin: 4.2 g/dL (ref 3.5–5.0)
BILIRUBIN TOTAL: 1.9 mg/dL — AB (ref 0.3–1.2)
BUN: 12 mg/dL (ref 6–20)
CALCIUM: 9.4 mg/dL (ref 8.9–10.3)
CO2: 23 mmol/L (ref 22–32)
CREATININE: 0.84 mg/dL (ref 0.44–1.00)
Chloride: 106 mmol/L (ref 101–111)
Glucose, Bld: 92 mg/dL (ref 65–99)
Potassium: 2.7 mmol/L — CL (ref 3.5–5.1)
Sodium: 138 mmol/L (ref 135–145)
TOTAL PROTEIN: 7.9 g/dL (ref 6.5–8.1)

## 2016-06-29 LAB — URINALYSIS, MICROSCOPIC (REFLEX)

## 2016-06-29 LAB — URINALYSIS, ROUTINE W REFLEX MICROSCOPIC
Glucose, UA: NEGATIVE mg/dL
Hgb urine dipstick: NEGATIVE
NITRITE: POSITIVE — AB
PH: 6.5 (ref 5.0–8.0)
PROTEIN: 30 mg/dL — AB
Specific Gravity, Urine: 1.03 (ref 1.005–1.030)

## 2016-06-29 MED ORDER — CEPHALEXIN 500 MG PO CAPS: 500.0000 mg | ORAL_CAPSULE | Freq: Four times a day (QID) | ORAL | 0 refills | Status: DC

## 2016-06-29 MED ORDER — POTASSIUM CHLORIDE 20 MEQ PO PACK
40.0000 meq | PACK | Freq: Once | ORAL | Status: DC
  Filled 2016-06-29: qty 2

## 2016-06-29 MED ORDER — POTASSIUM CHLORIDE 20 MEQ/15ML (10%) PO SOLN
ORAL | Status: AC
  Filled 2016-06-29: qty 30

## 2016-06-29 MED ORDER — SODIUM CHLORIDE 0.9 % IV SOLN
INTRAVENOUS | Status: AC
Start: 2016-06-29 — End: 2016-06-29
  Administered 2016-06-29: 11:00:00 via INTRAVENOUS

## 2016-06-29 MED ORDER — ONDANSETRON HCL 4 MG/2ML IJ SOLN
4.0000 mg | Freq: Once | INTRAMUSCULAR | Status: AC
  Administered 2016-06-29: 4 mg via INTRAVENOUS
  Filled 2016-06-29: qty 2

## 2016-06-29 MED ORDER — METOCLOPRAMIDE HCL 5 MG/ML IJ SOLN
10.0000 mg | Freq: Once | INTRAMUSCULAR | Status: AC
  Administered 2016-06-29: 10 mg via INTRAVENOUS
  Filled 2016-06-29: qty 2

## 2016-06-29 MED ORDER — PROMETHAZINE HCL 25 MG RE SUPP: 25.0000 mg | Freq: Four times a day (QID) | RECTAL | 0 refills | Status: DC | PRN

## 2016-06-29 MED ORDER — ONDANSETRON 4 MG PO TBDP: 4.0000 mg | ORAL_TABLET | Freq: Three times a day (TID) | ORAL | 0 refills | Status: DC | PRN

## 2016-06-29 MED ORDER — POTASSIUM CHLORIDE CRYS ER 20 MEQ PO TBCR: 40.0000 meq | EXTENDED_RELEASE_TABLET | Freq: Every day | ORAL | 0 refills | Status: DC

## 2016-06-29 MED ORDER — METOCLOPRAMIDE HCL 10 MG PO TABS: 10.0000 mg | ORAL_TABLET | Freq: Four times a day (QID) | ORAL | 0 refills | Status: DC

## 2016-06-29 MED ORDER — CEFTRIAXONE SODIUM 1 G IJ SOLR
1.0000 g | Freq: Once | INTRAMUSCULAR | Status: AC
  Administered 2016-06-29: 1 g via INTRAVENOUS
  Filled 2016-06-29: qty 10

## 2016-06-29 MED ORDER — POTASSIUM CHLORIDE 20 MEQ/15ML (10%) PO SOLN
40.0000 meq | Freq: Once | ORAL | Status: AC
  Administered 2016-06-29: 40 meq via ORAL

## 2016-06-29 MED ORDER — PROMETHAZINE HCL 25 MG/ML IJ SOLN
25.0000 mg | Freq: Once | INTRAMUSCULAR | Status: AC
  Administered 2016-06-29: 25 mg via INTRAMUSCULAR
  Filled 2016-06-29: qty 1

## 2016-06-29 NOTE — ED Provider Notes (Signed)
[facility] Provider Note   CSN: [id number] Arrival date & time: 06/29/16  0950     History   Chief Complaint Chief Complaint  Patient presents with  . Emesis    HPI Debra Thornton is a 21 y.o. female with hx of pancreatitis, colitis and hypokalemia presents to the ED with n/v and feeling dehydrated. Patient states she was here yesterday and received IV fluids and felt better. She went home with Phenergan for nausea but the symptoms got worse instead of better. She vomited when she took the phenergan. Today she feels dehydrated and continues to have n/v. Patient denies fever but has had chills.  HPI  Past Medical History:  Diagnosis Date  . Colitis   . Esophageal yeast infection (HCC)   . Pancreatitis     Patient Active Problem List   Diagnosis Date Noted  . Hypokalemia 03/10/2016  . Acute kidney injury (HCC) 03/10/2016  . Nausea and vomiting 03/08/2016    History reviewed. No pertinent surgical history.  OB History    No data available       Home Medications    Prior to Admission medications   Medication Sig Start Date End Date Taking? Authorizing Provider  cephALEXin (KEFLEX) 500 MG capsule Take 1 capsule (500 mg total) by mouth 4 (four) times daily. 06/29/16   Emi Lymon Orlene Och, NP  LORazepam (ATIVAN) 1 MG tablet Take 1 tablet (1 mg total) by mouth 2 (two) times daily as needed (Vomiting). 03/10/16   Narda Bonds, MD  metoCLOPramide (REGLAN) 10 MG tablet Take 1 tablet (10 mg total) by mouth every 6 (six) hours. 06/29/16   Soloman Mckeithan Orlene Och, NP  norelgestromin-ethinyl estradiol (ORTHO EVRA) 150-35 MCG/24HR transdermal patch Place 1 patch onto the skin once a week.    Historical Provider, MD  potassium chloride 20 MEQ TBCR Take 10 mEq by mouth 2 (two) times daily. 05/03/16 05/06/16  Dwana Melena, DO  potassium chloride SA (K-DUR,KLOR-CON) 20 MEQ tablet Take 2 tablets (40 mEq total) by mouth daily. 06/29/16   Michelina Mexicano Orlene Och, NP  promethazine (PHENERGAN) 25 MG tablet Take 1 tablet  (25 mg total) by mouth every 6 (six) hours as needed for nausea or vomiting. 06/28/16   Paula Libra, MD    Family History History reviewed. No pertinent family history.  Social History Social History  Substance Use Topics  . Smoking status: Former Smoker    Types: Cigarettes  . Smokeless tobacco: Never Used  . Alcohol use No     Allergies   Patient has no known allergies.   Review of Systems Review of Systems  Constitutional: Positive for chills. Negative for fever.  HENT: Positive for sore throat (with vomiting). Negative for congestion and ear pain.   Eyes: Negative for visual disturbance.  Respiratory: Negative for cough, shortness of breath and wheezing.   Cardiovascular: Negative for chest pain.  Gastrointestinal: Positive for nausea and vomiting. Negative for abdominal pain and diarrhea.  Genitourinary: Negative for dysuria, urgency, vaginal bleeding and vaginal discharge.  Musculoskeletal: Negative for back pain, myalgias and neck pain.  Skin: Negative for rash.  Neurological: Negative for syncope and headaches.  Psychiatric/Behavioral: Negative for confusion.     Physical Exam Updated Vital Signs BP 133/88 (BP Location: Right Arm)   Pulse 62   Temp 97.8 F (36.6 C) (Oral)   Resp 16   Ht 5\' 4"  (1.626 m)   Wt 81.6 kg   LMP 06/15/2016 (Approximate)   SpO2 100%   BMI  30.90 kg/m   Physical Exam  Constitutional: She is oriented to person, place, and time. She appears well-developed and well-nourished. No distress.  HENT:  Head: Normocephalic.  Right Ear: Tympanic membrane normal.  Left Ear: Tympanic membrane normal.  Nose: Nose normal.  Mouth/Throat: Uvula is midline, oropharynx is clear and moist and mucous membranes are normal.  Eyes: EOM are normal.  Neck: Neck supple.  Cardiovascular: Normal rate and regular rhythm.   Pulmonary/Chest: Effort normal and breath sounds normal.  Abdominal: Soft. Bowel sounds are normal. There is no tenderness.    Musculoskeletal: Normal range of motion.  Neurological: She is alert and oriented to person, place, and time. No cranial nerve deficit.  Skin: Skin is warm and dry.  Psychiatric: She has a normal mood and affect. Her behavior is normal.  Nursing note and vitals reviewed.  Patient has not vomited since arrival but she has been spitting in an emesis bag.   After IV hydration and medications patient states she feels better and is taking PO fluids without n/v. She appears stable for d/c. Will d/c home with medications for n/v and K.  Patient also with UTI and Rocephin 1 gram given IV, will continue treatment with Keflex while urine culture is pending.  Discussed return precautions with patient.  ED Treatments / Results  Labs (all labs ordered are listed, but only abnormal results are displayed) Labs Reviewed  COMPREHENSIVE METABOLIC PANEL - Abnormal; Notable for the following:       Result Value   Potassium 2.7 (*)    AST 46 (*)    ALT 63 (*)    Total Bilirubin 1.9 (*)    All other components within normal limits  URINALYSIS, ROUTINE W REFLEX MICROSCOPIC - Abnormal; Notable for the following:    Color, Urine ORANGE (*)    APPearance CLOUDY (*)    Bilirubin Urine MODERATE (*)    Ketones, ur >80 (*)    Protein, ur 30 (*)    Nitrite POSITIVE (*)    Leukocytes, UA SMALL (*)    All other components within normal limits  URINALYSIS, MICROSCOPIC (REFLEX) - Abnormal; Notable for the following:    Bacteria, UA MANY (*)    Squamous Epithelial / LPF 6-30 (*)    All other components within normal limits  URINE CULTURE  CBC WITH DIFFERENTIAL/PLATELET   Radiology No results found.  Procedures Procedures (including critical care time)  Medications Ordered in ED Medications  0.9 %  sodium chloride infusion ( Intravenous Stopped 06/29/16 1432)  metoCLOPramide (REGLAN) injection 10 mg (10 mg Intravenous Given 06/29/16 1120)  cefTRIAXone (ROCEPHIN) 1 g in dextrose 5 % 50 mL IVPB (0 g  Intravenous Stopped 06/29/16 1328)  potassium chloride 20 MEQ/15ML (10%) solution 40 mEq (40 mEq Oral Given 06/29/16 1238)  ondansetron (ZOFRAN) injection 4 mg (4 mg Intravenous Given 06/29/16 1329)  promethazine (PHENERGAN) injection 25 mg (25 mg Intramuscular Given 06/29/16 1559)     Initial Impression / Assessment and Plan / ED Course  I have reviewed the triage vital signs and the nursing notes.  Pertinent lab results that were available during my care of the patient were reviewed by me and considered in my medical decision making (see chart for details).    Final Clinical Impressions(s) / ED Diagnoses   Final diagnoses:  Non-intractable vomiting with nausea, unspecified vomiting type  Acute cystitis without hematuria  Hypokalemia    New Prescriptions Discharge Medication List as of 06/29/2016  3:00 PM  START taking these medications   Details  cephALEXin (KEFLEX) 500 MG capsule Take 1 capsule (500 mg total) by mouth 4 (four) times daily., Starting Sun 06/29/2016, Print    potassium chloride SA (K-DUR,KLOR-CON) 20 MEQ tablet Take 2 tablets (40 mEq total) by mouth daily., Starting Sun 06/29/2016, Print         Camp[place], NP 06/30/16 1620    Arby Barrette, MD 07/09/16 [phone]

## 2016-06-29 NOTE — ED Notes (Signed)
Pt given d/c instructions as per chart. Rx x 3. Verbalizes understanding. No questions. 

## 2016-06-29 NOTE — ED Notes (Signed)
Pt has tolerated Ginger Ale w/o vomiting

## 2016-06-29 NOTE — ED Notes (Signed)
Critical lab results given to A. Peele.  K+ 2.7

## 2016-06-29 NOTE — ED Triage Notes (Signed)
Pt c/o NV x 2 days.

## 2016-06-30 LAB — URINE CULTURE: Culture: NO GROWTH

## 2016-07-01 DIAGNOSIS — R748 Abnormal levels of other serum enzymes: Secondary | ICD-10-CM | POA: Diagnosis not present

## 2016-07-01 DIAGNOSIS — G43A1 Cyclical vomiting, intractable: Secondary | ICD-10-CM | POA: Diagnosis not present

## 2016-07-01 DIAGNOSIS — E876 Hypokalemia: Secondary | ICD-10-CM | POA: Diagnosis not present

## 2016-07-02 DIAGNOSIS — R112 Nausea with vomiting, unspecified: Secondary | ICD-10-CM | POA: Diagnosis not present

## 2016-07-02 DIAGNOSIS — B3781 Candidal esophagitis: Secondary | ICD-10-CM | POA: Diagnosis not present

## 2016-07-02 MED FILL — FLUCONAZOLE 200 MG TABLET: 200 | 10 days supply | Qty: 11 | Fill #0

## 2016-07-24 DIAGNOSIS — K920 Hematemesis: Secondary | ICD-10-CM | POA: Diagnosis not present

## 2016-07-24 DIAGNOSIS — D72829 Elevated white blood cell count, unspecified: Secondary | ICD-10-CM | POA: Diagnosis not present

## 2016-07-24 DIAGNOSIS — R110 Nausea: Secondary | ICD-10-CM | POA: Diagnosis not present

## 2016-07-25 DIAGNOSIS — R112 Nausea with vomiting, unspecified: Secondary | ICD-10-CM | POA: Diagnosis not present

## 2016-07-25 DIAGNOSIS — K920 Hematemesis: Secondary | ICD-10-CM | POA: Diagnosis not present

## 2016-07-25 DIAGNOSIS — R630 Anorexia: Secondary | ICD-10-CM | POA: Diagnosis not present

## 2016-07-25 DIAGNOSIS — R1013 Epigastric pain: Secondary | ICD-10-CM | POA: Diagnosis not present

## 2016-07-25 DIAGNOSIS — F1210 Cannabis abuse, uncomplicated: Secondary | ICD-10-CM | POA: Diagnosis not present

## 2016-07-25 DIAGNOSIS — D72829 Elevated white blood cell count, unspecified: Secondary | ICD-10-CM | POA: Diagnosis not present

## 2016-07-26 DIAGNOSIS — R112 Nausea with vomiting, unspecified: Secondary | ICD-10-CM | POA: Diagnosis not present

## 2016-07-26 DIAGNOSIS — D72829 Elevated white blood cell count, unspecified: Secondary | ICD-10-CM | POA: Diagnosis not present

## 2016-07-26 DIAGNOSIS — K920 Hematemesis: Secondary | ICD-10-CM | POA: Diagnosis not present

## 2016-07-26 DIAGNOSIS — R9431 Abnormal electrocardiogram [ECG] [EKG]: Secondary | ICD-10-CM | POA: Diagnosis not present

## 2016-07-27 DIAGNOSIS — R112 Nausea with vomiting, unspecified: Secondary | ICD-10-CM | POA: Diagnosis not present

## 2016-07-27 DIAGNOSIS — K920 Hematemesis: Secondary | ICD-10-CM | POA: Diagnosis not present

## 2016-07-27 DIAGNOSIS — D72829 Elevated white blood cell count, unspecified: Secondary | ICD-10-CM | POA: Diagnosis not present

## 2016-08-05 DIAGNOSIS — R112 Nausea with vomiting, unspecified: Secondary | ICD-10-CM | POA: Diagnosis not present

## 2016-08-05 DIAGNOSIS — K5900 Constipation, unspecified: Secondary | ICD-10-CM | POA: Diagnosis not present

## 2016-08-05 DIAGNOSIS — B3781 Candidal esophagitis: Secondary | ICD-10-CM | POA: Diagnosis not present

## 2016-08-05 MED FILL — POLYETHYLENE GLYCOL 3350 PO: 30 days supply | Qty: 1054 | Fill #0

## 2016-08-29 ENCOUNTER — Ambulatory Visit: Payer: Self-pay | Admitting: Allergy

## 2016-08-29 MED FILL — METOCLOPRAMIDE 10 MG TABLET: 10 | 30 days supply | Qty: 120 | Fill #0

## 2016-09-22 DIAGNOSIS — R112 Nausea with vomiting, unspecified: Secondary | ICD-10-CM | POA: Diagnosis not present

## 2016-09-25 ENCOUNTER — Ambulatory Visit: Payer: Self-pay | Admitting: Allergy

## 2016-09-25 ENCOUNTER — Other Ambulatory Visit: Payer: Self-pay | Admitting: *Deleted

## 2016-09-25 ENCOUNTER — Encounter: Payer: Self-pay | Admitting: Allergy & Immunology

## 2016-09-25 ENCOUNTER — Ambulatory Visit (INDEPENDENT_AMBULATORY_CARE_PROVIDER_SITE_OTHER): Payer: BLUE CROSS/BLUE SHIELD | Admitting: Allergy & Immunology

## 2016-09-25 VITALS — BP 110/60 | HR 83 | Temp 98.1°F | Resp 17 | Ht 64.57 in | Wt 182.0 lb

## 2016-09-25 DIAGNOSIS — T7819XD Other adverse food reactions, not elsewhere classified, subsequent encounter: Secondary | ICD-10-CM

## 2016-09-25 DIAGNOSIS — T781XXD Other adverse food reactions, not elsewhere classified, subsequent encounter: Secondary | ICD-10-CM

## 2016-09-25 DIAGNOSIS — Z77120 Contact with and (suspected) exposure to mold (toxic): Secondary | ICD-10-CM | POA: Diagnosis not present

## 2016-09-25 DIAGNOSIS — R1110 Vomiting, unspecified: Secondary | ICD-10-CM

## 2016-09-25 MED ORDER — EPINEPHRINE 0.3 MG/0.3ML IJ SOAJ: 0.3000 mg | Freq: Once | INTRAMUSCULAR | 1 refills | Status: DC

## 2016-09-25 NOTE — Progress Notes (Signed)
 NEW PATIENT  Date of Service/Encounter:  09/25/16  Referring provider: Schoenhoff, Deborah D, MD   Assessment:   Recurrent vomiting - ? cyclic vomiting syndrome  Mold suspected exposure - with sensitizations only to seasonal molds (Alternaria, Cladosporium)  Adverse food reaction - sensitizations to peanut, pork, white potato, green pea, carrots, celery, pecan, and walnut   Plan/Recommendations:    1. Recurrent vomiting - Testing today showed: mild positives to peanut, pork, white potato, green pea, carrots, celery, pecan, and walnut - There is a low positive predictive value of food allergy testing and hence the high possibility of false positives. - Food allergy testing has a high negative predictive value, therefore if testing is negative we can be relatively assured that they are indeed negative.  - I would recommend avoiding the above foods for 2-3 months to see if this improves the frequency of the vomiting episodes. - We will send in an AuviQ (epinephrine auto-injector) to have on hand in case symptoms because very severe. - We will touch base in 3 months to see how things are going.  - We will get the records from Dr. Hung to see what the biopsies from the endoscopy showed. 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  - I will send a note to Paula Stuart PA to keep her in the loop.  - Symptoms truly seem most consistent with cyclic vomiting syndrome, therefore I will likely defer to GI for medication management for this.  - This would be a rather odd presentation for a food allergy, to be sure.   2. Mold suspected exposure - Testing today showed: positive only to a seasonal mold mix (Alternaria, Cladosporium). - These are unlikely to be the molds that are present in your dorm. - Therefore I do not think that the vomiting is related at all to the mold.  3. Return in about 3 months (around  12/26/2016).   Subjective:    Debra Thornton is a 21 y.o. female presenting today for evaluation of  Chief Complaint  Patient presents with  . Allergy Testing  . Food Intolerance    not sure what the food may be.     [place] has a history of the following: Patient Active Problem List   Diagnosis Date Noted  . Hypokalemia 03/10/2016  . Acute kidney injury (HCC) 03/10/2016  . Nausea and vomiting 03/08/2016    History obtained from: chart review and patient and her father.   [place] was referred by Schoenhoff, Deborah D, MD.      is a 21 y.o. female presenting for an allergy evaluation secondary to recurrent vomiting episodes. Episodes started back in 2016 when she went to start college. Apparently the cafeteria adds fiber to the food "for the athletes", which everyone receives. Otherwise,  is unsure what else is in the cafeteria food.  She has never had any episodes wen she lives at home. Episodes usually happen around one or two months apart. Episodes last for 3-4 days. She starts with some stomach discomfort and then multiple recurrent episodes of vomiting. She does not have any diarrhea from this. She has not shortness of breath, hives, or throat swelling from this. She does end up in the hospital and she is placed on an IV and she is given antinausea medications that help, especially Reglan. She does have intermittent headaches although they are not associated with the vomiting. They do not occur at   the same time. Episodes started when she started college and they are just now looking into the workup for this. She denies food impaction, stomach pain, and weight loss.    does not note any particular foods that lead to this. She did have spaghetti at some point as well as chicken nuggets that caused vomiting. She thinks that the additives in the food in the cafeteria at school are essentially just fiber.   She has been by Dr. Stuart and had a stomach emptying test which was  normal. Patient reports that her plan after this is to see "a guru". She is planning to do an endoscopy, although this has not been planned year. She has seen Dr. Hung and had an endoscopy in February that noted a Candidal infection. She was treated with anti-fungal treatment.  felt that she needed a second opinion so she went to see Dr. Stuart. She has been diagnosed with cyclic vomiting syndrome at one point. She has been admitted 3-4 times, typically for rehydration purposes.   She has been seen by [facility] in 2016 and had some testing that demonstrated sensitizations to tomatoes, celery, peanuts, grasses, dogs, and some others. She was seen for environmental allergy evaluation. She does eat peanut butter, ketchup, and celery without a problem. She has never followed up with their group.  She has no history of asthma or allergic rhinitis symptoms. Otherwise, there is no history of other atopic diseases, including asthma, drug allergies, stinging insect allergies, or urticaria. There is no significant infectious history. Vaccinations are up to date.     Past Medical History: Patient Active Problem List   Diagnosis Date Noted  . Hypokalemia 03/10/2016  . Acute kidney injury (HCC) 03/10/2016  . Nausea and vomiting 03/08/2016    Medication List:  Allergies as of 09/25/2016   No Known Allergies     Medication List       Accurate as of 09/25/16  4:38 PM. Always use your most recent med list.          LORazepam 1 MG tablet Commonly known as:  ATIVAN Take 1 tablet (1 mg total) by mouth 2 (two) times daily as needed (Vomiting).   metoCLOPramide 10 MG tablet Commonly known as:  REGLAN Take 1 tablet (10 mg total) by mouth every 6 (six) hours.   norelgestromin-ethinyl estradiol 150-35 MCG/24HR transdermal patch Commonly known as:  ORTHO EVRA Place 1 patch onto the skin once a week.   polyethylene glycol powder powder Commonly known as:  GLYCOLAX/MIRALAX   potassium chloride  SA 20 MEQ tablet Commonly known as:  K-DUR,KLOR-CON Take 2 tablets (40 mEq total) by mouth daily.   promethazine 25 MG tablet Commonly known as:  PHENERGAN Take 1 tablet (25 mg total) by mouth every 6 (six) hours as needed for nausea or vomiting.       Birth History: non-contributory.   Developmental History:  has met all milestones on time. She has required no speech therapy, occupational therapy, or physical therapy.  Past Surgical History: Past Surgical History:  Procedure Laterality Date  . NO PAST SURGERIES       Family History: Family History  Problem Relation Age of Onset  . Allergic rhinitis Father   . Asthma Father   . Urticaria Father   . Asthma Paternal Aunt   . Eczema Paternal Uncle   . Asthma Paternal Grandfather      Social History:  lives at home with her mother and father. She lives in   a house that is 65 years old. There is carpeting throughout the home. They have gas heating and central cooling. There are no animals inside or outside of the home. There are no dust mite coverings on the bedding. There is tobacco exposure in the house. She is on college and pursuing a degree in African American studies.     Review of Systems: a 14-point review of systems is pertinent for what is mentioned in HPI.  Otherwise, all other systems were negative. Constitutional: negative other than that listed in the HPI Eyes: negative other than that listed in the HPI Ears, nose, mouth, throat, and face: negative other than that listed in the HPI Respiratory: negative other than that listed in the HPI Cardiovascular: negative other than that listed in the HPI Gastrointestinal: negative other than that listed in the HPI Genitourinary: negative other than that listed in the HPI Integument: negative other than that listed in the HPI Hematologic: negative other than that listed in the HPI Musculoskeletal: negative other than that listed in the HPI Neurological: negative  other than that listed in the HPI Allergy/Immunologic: negative other than that listed in the HPI    Objective:   Blood pressure 110/60, pulse 83, temperature 98.1 F (36.7 C), temperature source Oral, resp. rate 17, height 5' 4.57" (1.64 m), weight 182 lb (82.6 kg), SpO2 97 %. Body mass index is 30.69 kg/m.   Physical Exam:  General: Alert, interactive, in no acute distress. Pleasant female. Very interactive.  Eyes: No conjunctival injection present on the right, No conjunctival injection present on the left, PERRL bilaterally, No discharge on the right, No discharge on the left and No Horner-Trantas dots present Ears: Right TM pearly gray with normal light reflex, Left TM pearly gray with normal light reflex, Right TM intact without perforation and Left TM intact without perforation.  Nose/Throat: External nose within normal limits and septum midline, turbinates edematous with clear discharge, post-pharynx mildly erythematous without cobblestoning in the posterior oropharynx. Tonsils 2+ without exudates Neck: Supple without thyromegaly.  Adenopathy: no enlarged lymph nodes appreciated in the anterior cervical, occipital, axillary, epitrochlear, inguinal, or popliteal regions Lungs: Clear to auscultation without wheezing, rhonchi or rales. No increased work of breathing. CV: Normal S1/S2, no murmurs. Capillary refill <2 seconds.  Abdomen: Nondistended, nontender. No guarding or rebound tenderness. Bowel sounds present in all fields and hyperactive  Skin: Warm and dry, without lesions or rashes. Extremities:  No clubbing, cyanosis or edema. Neuro:   Grossly intact. No focal deficits appreciated. Responsive to questions.  Diagnostic studies  Allergy Studies:   Indoor/Outdoor Percutaneous Adult Mold Panel: negative to the entire mold panel with adequate controls.  Indoor/Outdoor Intradermal Adult Mold Panel: positive to mold mix #1 (Alternaria and Cladosporium), otherwise negative to  the remaining mold mixes with adequate controls  Entire Food Panel: positive to peanut (2 x 4), pork (2 x 3), white potato (2 x 4), English pea (2 x 3), carrots (4) 9), celery (4 x 6), pecan (3 x 10), and walnuts (2 x 4). Otherwise negative with adequate controls.     Salvatore Marvel, MD [facility]

## 2016-09-25 NOTE — Patient Instructions (Addendum)
1. Recurrent vomiting - Testing today showed: mild positives to peanut, pork, white potato, green pea, carrots, celery, pecan, and walnut - There is a low positive predictive value of food allergy testing and hence the high possibility of false positives. - Food allergy testing has a high negative predictive value, therefore if testing is negative we can be relatively assured that they are indeed negative.  - I would recommend avoiding the above foods for 2-3 months to see if this improves the frequency of the vomiting episodes. - We will send in an AuviQ (epinephrine auto-injector) to have on hand in case symptoms because very severe. - We will touch base in 3 months to see how things are going.  - We will get the records from Dr. Elnoria HowardHung to see what the biopsies from the endoscopy showed. 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   2. Mold suspected exposure - Testing today showed: positive only to a seasonal mold mix (Alternaria, Cladosporium). - These are unlikely to be the molds that are present in your dorm. - Therefore I do not think that the vomiting is related at all to the mold.  3. Return in about 3 months (around 12/26/2016).  Please inform us of any Emergency Department visits, hospitalizations, or changes in symptoms. Call us before going to the ED for breathing or allergy symptoms since we might be able to fit you in for a sick visit. Feel free to contact us anytime with any questions, problems, or concerns.  It was a pleasure to meet you and your family today! Happy spring!   Websites that have reliable patient information: 1. American Academy of Asthma, Allergy, and Immunology: www.aaaai.org 2. Food Allergy Research and Education (FARE): foodallergy.org 3. Mothers of Asthmatics: http://www.asthmacommunitynetwork.org 4. American College of Allergy, Asthma, and Immunology: www.acaai.org  Control of Mold Allergen  Mold  and fungi can grow on a variety of surfaces provided certain temperature and moisture conditions exist.  Outdoor molds grow on plants, decaying vegetation and soil.  The major outdoor mold, Alternaria and Cladosporium, are found in very high numbers during hot and dry conditions.  Generally, a late Summer - Fall peak is seen for common outdoor fungal spores.  Rain will temporarily lower outdoor mold spore count, but counts rise rapidly when the rainy period ends.  The most important indoor molds are Aspergillus and Penicillium.  Dark, humid and poorly ventilated basements are ideal sites for mold growth.  The next most common sites of mold growth are the bathroom and the kitchen.  Outdoor MicrosoftMold Control 1. Use air conditioning and keep windows closed 2. Avoid exposure to decaying vegetation. 3. Avoid leaf raking. 4. Avoid grain handling. 5. Consider wearing a face mask if working in moldy areas.  Indoor Mold Control 1. Maintain humidity below 50%. 2. Clean washable surfaces with 5% bleach solution. 3. Remove sources e.g. contaminated carpets.

## 2016-10-18 ENCOUNTER — Encounter (HOSPITAL_BASED_OUTPATIENT_CLINIC_OR_DEPARTMENT_OTHER): Payer: Self-pay | Admitting: Emergency Medicine

## 2016-10-18 ENCOUNTER — Encounter (HOSPITAL_COMMUNITY): Payer: Self-pay | Admitting: Emergency Medicine

## 2016-10-18 ENCOUNTER — Emergency Department (HOSPITAL_BASED_OUTPATIENT_CLINIC_OR_DEPARTMENT_OTHER)
Admission: EM | Admit: 2016-10-18 | Discharge: 2016-10-18 | Disposition: A | Discharge status: Home / Self Care | Payer: BLUE CROSS/BLUE SHIELD | Attending: Emergency Medicine | Admitting: Emergency Medicine

## 2016-10-18 ENCOUNTER — Emergency Department (HOSPITAL_COMMUNITY)
Admission: EM | Admit: 2016-10-18 | Discharge: 2016-10-18 | Disposition: A | Discharge status: Home / Self Care | Payer: BLUE CROSS/BLUE SHIELD | Source: Home / Self Care | Attending: Emergency Medicine | Admitting: Emergency Medicine

## 2016-10-18 ENCOUNTER — Emergency Department (HOSPITAL_COMMUNITY)
Admission: EM | Admit: 2016-10-18 | Discharge: 2016-10-18 | Discharge status: Against Medical Advice | Payer: BLUE CROSS/BLUE SHIELD

## 2016-10-18 DIAGNOSIS — E860 Dehydration: Secondary | ICD-10-CM

## 2016-10-18 DIAGNOSIS — Z87891 Personal history of nicotine dependence: Secondary | ICD-10-CM

## 2016-10-18 DIAGNOSIS — R1115 Cyclical vomiting syndrome unrelated to migraine: Secondary | ICD-10-CM

## 2016-10-18 DIAGNOSIS — Z79899 Other long term (current) drug therapy: Secondary | ICD-10-CM

## 2016-10-18 DIAGNOSIS — R1110 Vomiting, unspecified: Secondary | ICD-10-CM | POA: Diagnosis not present

## 2016-10-18 DIAGNOSIS — R112 Nausea with vomiting, unspecified: Secondary | ICD-10-CM

## 2016-10-18 DIAGNOSIS — E876 Hypokalemia: Secondary | ICD-10-CM | POA: Insufficient documentation

## 2016-10-18 LAB — URINALYSIS, ROUTINE W REFLEX MICROSCOPIC
Glucose, UA: NEGATIVE mg/dL
HGB URINE DIPSTICK: NEGATIVE
Ketones, ur: >80 mg/dL — AB
NITRITE: NEGATIVE
Protein, ur: >300 mg/dL — AB
Specific Gravity, Urine: 1.044 — ABNORMAL HIGH (ref 1.005–1.030)
pH: 6.5 (ref 5.0–8.0)

## 2016-10-18 LAB — COMPREHENSIVE METABOLIC PANEL
ALK PHOS: 65 U/L (ref 38–126)
ALT: 19 U/L (ref 14–54)
ANION GAP: 14 (ref 5–15)
AST: 24 U/L (ref 15–41)
Albumin: 5.1 g/dL — ABNORMAL HIGH (ref 3.5–5.0)
BUN: 15 mg/dL (ref 6–20)
CALCIUM: 10.2 mg/dL (ref 8.9–10.3)
CO2: 19 mmol/L — AB (ref 22–32)
Chloride: 108 mmol/L (ref 101–111)
Creatinine, Ser: 1.15 mg/dL — ABNORMAL HIGH (ref 0.44–1.00)
GFR calc Af Amer: >60 mL/min (ref >60)
GFR calc non Af Amer: >60 mL/min (ref >60)
Glucose, Bld: 123 mg/dL — ABNORMAL HIGH (ref 65–99)
POTASSIUM: 3.1 mmol/L — AB (ref 3.5–5.1)
SODIUM: 141 mmol/L (ref 135–145)
Total Bilirubin: 0.8 mg/dL (ref 0.3–1.2)
Total Protein: 9.2 g/dL — ABNORMAL HIGH (ref 6.5–8.1)

## 2016-10-18 LAB — CBC WITH DIFFERENTIAL/PLATELET
Basophils Absolute: 0 10*3/uL (ref 0.0–0.1)
Basophils Relative: 0 %
EOS PCT: 0 %
Eosinophils Absolute: 0 10*3/uL (ref 0.0–0.7)
HCT: 41.7 % (ref 36.0–46.0)
HEMOGLOBIN: 15.3 g/dL — AB (ref 12.0–15.0)
LYMPHS ABS: 1.6 10*3/uL (ref 0.7–4.0)
Lymphocytes Relative: 11 %
MCH: 31 pg (ref 26.0–34.0)
MCHC: 36.7 g/dL — AB (ref 30.0–36.0)
MCV: 84.6 fL (ref 78.0–100.0)
MONO ABS: 1.3 10*3/uL — AB (ref 0.1–1.0)
MONOS PCT: 9 %
Neutro Abs: 11 10*3/uL — ABNORMAL HIGH (ref 1.7–7.7)
Neutrophils Relative %: 79 %
PLATELETS: 333 10*3/uL (ref 150–400)
RBC: 4.93 MIL/uL (ref 3.87–5.11)
RDW: 12.9 % (ref 11.5–15.5)
WBC: 13.9 10*3/uL — ABNORMAL HIGH (ref 4.0–10.5)

## 2016-10-18 LAB — LIPASE, BLOOD: Lipase: 27 U/L (ref 11–51)

## 2016-10-18 LAB — CBG MONITORING, ED: GLUCOSE-CAPILLARY: 115 mg/dL — AB (ref 65–99)

## 2016-10-18 LAB — URINALYSIS, MICROSCOPIC (REFLEX)

## 2016-10-18 LAB — PREGNANCY, URINE: PREG TEST UR: NEGATIVE

## 2016-10-18 MED ORDER — POTASSIUM CHLORIDE CRYS ER 20 MEQ PO TBCR
40.0000 meq | EXTENDED_RELEASE_TABLET | Freq: Once | ORAL | Status: AC
  Administered 2016-10-18: 40 meq via ORAL
  Filled 2016-10-18: qty 2

## 2016-10-18 MED ORDER — ONDANSETRON HCL 4 MG/2ML IJ SOLN
4.0000 mg | Freq: Once | INTRAMUSCULAR | Status: AC
  Administered 2016-10-18: 4 mg via INTRAVENOUS
  Filled 2016-10-18: qty 2

## 2016-10-18 MED ORDER — PROMETHAZINE HCL 25 MG/ML IJ SOLN
25.0000 mg | Freq: Once | INTRAMUSCULAR | Status: AC
  Administered 2016-10-18: 25 mg via INTRAVENOUS
  Filled 2016-10-18: qty 1

## 2016-10-18 MED ORDER — POTASSIUM CHLORIDE CRYS ER 20 MEQ PO TBCR: 40.0000 meq | EXTENDED_RELEASE_TABLET | Freq: Every day | ORAL | 0 refills | Status: DC

## 2016-10-18 MED ORDER — ONDANSETRON 8 MG PO TBDP: 8.0000 mg | ORAL_TABLET | Freq: Three times a day (TID) | ORAL | 0 refills | Status: DC | PRN

## 2016-10-18 MED ORDER — SODIUM CHLORIDE 0.9 % IV BOLUS (SEPSIS)
1000.0000 mL | Freq: Once | INTRAVENOUS | Status: AC
  Administered 2016-10-18: 1000 mL via INTRAVENOUS

## 2016-10-18 MED ORDER — METOCLOPRAMIDE HCL 10 MG PO TABS
5.0000 mg | ORAL_TABLET | Freq: Once | ORAL | Status: AC
  Administered 2016-10-18: 5 mg via ORAL
  Filled 2016-10-18: qty 1

## 2016-10-18 MED ORDER — PROCHLORPERAZINE EDISYLATE 5 MG/ML IJ SOLN
10.0000 mg | Freq: Once | INTRAMUSCULAR | Status: AC
  Administered 2016-10-18: 10 mg via INTRAVENOUS
  Filled 2016-10-18: qty 2

## 2016-10-18 MED ORDER — METOCLOPRAMIDE HCL 10 MG PO TABS: 10.0000 mg | ORAL_TABLET | Freq: Four times a day (QID) | ORAL | 0 refills | Status: DC | PRN

## 2016-10-18 NOTE — ED Provider Notes (Signed)
[facility]   CSN: [id number] Arrival date & time: 10/18/16  0113     History   Chief Complaint Chief Complaint  Patient presents with  . Emesis    HPI Debra Thornton is a 21 y.o. female.  HPI  This is a 21 year old female with history of recurrent nausea and vomiting, pancreatitis, esophageal infection who presents with vomiting. Patient reports vomiting since Thursday. Reports nonbilious, nonbloody emesis. Normal bowel movements. No diarrhea. Reports achy upper abdominal pain. Current pain is 7 out of 10. She has had similar symptoms in the past and has had a full workup including endoscopy and a gastric emptying study. She takes Reglan at home and states that most the time this helps but has not helped recently.  Past Medical History:  Diagnosis Date  . Colitis   . Esophageal yeast infection (HCC)   . Pancreatitis     Patient Active Problem List   Diagnosis Date Noted  . Hypokalemia 03/10/2016  . Acute kidney injury (HCC) 03/10/2016  . Nausea and vomiting 03/08/2016    Past Surgical History:  Procedure Laterality Date  . NO PAST SURGERIES      OB History    No data available       Home Medications    Prior to Admission medications   Medication Sig Start Date End Date Taking? Authorizing Provider  EPINEPHrine (AUVI-Q) 0.3 mg/0.3 mL IJ SOAJ injection Inject 0.3 mLs (0.3 mg total) into the muscle once. 09/25/16 09/25/16  Alfonse SpruceGallagher, Joel Louis, MD  LORazepam (ATIVAN) 1 MG tablet Take 1 tablet (1 mg total) by mouth 2 (two) times daily as needed (Vomiting). 03/10/16   Narda BondsNettey, Ralph A, MD  metoCLOPramide (REGLAN) 10 MG tablet Take 1 tablet (10 mg total) by mouth every 6 (six) hours. 06/29/16   Janne NapoleonNeese, Hope M, NP  norelgestromin-ethinyl estradiol (ORTHO EVRA) 150-35 MCG/24HR transdermal patch Place 1 patch onto the skin once a week.    [provider]  polyethylene glycol powder (GLYCOLAX/MIRALAX) powder  08/05/16   [provider]    potassium chloride SA (K-DUR,KLOR-CON) 20 MEQ tablet Take 2 tablets (40 mEq total) by mouth daily. 10/18/16   Lanore Renderos, Mayer Maskerourtney F, MD  promethazine (PHENERGAN) 25 MG tablet Take 1 tablet (25 mg total) by mouth every 6 (six) hours as needed for nausea or vomiting. 06/28/16   Molpus, Jonny RuizJohn, MD    Family History Family History  Problem Relation Age of Onset  . Allergic rhinitis Father   . Asthma Father   . Urticaria Father   . Asthma Paternal Aunt   . Eczema Paternal Uncle   . Asthma Paternal Grandfather     Social History Social History  Substance Use Topics  . Smoking status: Former Smoker    Types: Cigarettes  . Smokeless tobacco: Never Used  . Alcohol use No     Allergies   Patient has no known allergies.   Review of Systems Review of Systems  Constitutional: Negative for fever.  Respiratory: Negative for shortness of breath.   Cardiovascular: Negative for chest pain.  Gastrointestinal: Positive for abdominal pain, nausea and vomiting. Negative for constipation and diarrhea.  Genitourinary: Negative for hematuria.  All other systems reviewed and are negative.    Physical Exam Updated Vital Signs BP (!) 157/101 (BP Location: Right Arm)   Pulse (!) 114   Temp 98.5 F (36.9 C) (Oral)   Resp 20   Wt 77.1 kg (170 lb)   LMP 10/11/2016  SpO2 100%   BMI 28.67 kg/m   Physical Exam  Constitutional: She is oriented to person, place, and time. She appears well-developed and well-nourished.  HENT:  Head: Normocephalic and atraumatic.  Mucous membranes moist  Cardiovascular: Regular rhythm and normal heart sounds.   Tachycardia  Pulmonary/Chest: Effort normal and breath sounds normal. No respiratory distress. She has no wheezes.  Abdominal: Soft. Bowel sounds are normal. There is no tenderness. There is no rebound and no guarding.  Neurological: She is alert and oriented to person, place, and time.  Skin: Skin is warm and dry.  Psychiatric: She has a normal mood and  affect.  Nursing note and vitals reviewed.    ED Treatments / Results  Labs (all labs ordered are listed, but only abnormal results are displayed) Labs Reviewed  URINALYSIS, ROUTINE W REFLEX MICROSCOPIC - Abnormal; Notable for the following:       Result Value   Color, Urine AMBER (*)    APPearance CLOUDY (*)    Specific Gravity, Urine 1.044 (*)    Bilirubin Urine SMALL (*)    Ketones, ur >80 (*)    Protein, ur >300 (*)    Leukocytes, UA TRACE (*)    All other components within normal limits  CBC WITH DIFFERENTIAL/PLATELET - Abnormal; Notable for the following:    WBC 13.9 (*)    Hemoglobin 15.3 (*)    MCHC 36.7 (*)    Neutro Abs 11.0 (*)    Monocytes Absolute 1.3 (*)    All other components within normal limits  COMPREHENSIVE METABOLIC PANEL - Abnormal; Notable for the following:    Potassium 3.1 (*)    CO2 19 (*)    Glucose, Bld 123 (*)    Creatinine, Ser 1.15 (*)    Total Protein 9.2 (*)    Albumin 5.1 (*)    All other components within normal limits  URINALYSIS, MICROSCOPIC (REFLEX) - Abnormal; Notable for the following:    Bacteria, UA MANY (*)    Squamous Epithelial / LPF 6-30 (*)    All other components within normal limits  CBG MONITORING, ED - Abnormal; Notable for the following:    Glucose-Capillary 115 (*)    All other components within normal limits  PREGNANCY, URINE  LIPASE, BLOOD    EKG  EKG Interpretation None       Radiology No results found.  Procedures Procedures (including critical care time)  Medications Ordered in ED Medications  ondansetron (ZOFRAN) injection 4 mg (4 mg Intravenous Given 10/18/16 0151)  sodium chloride 0.9 % bolus 1,000 mL (0 mLs Intravenous Stopped 10/18/16 0232)  sodium chloride 0.9 % bolus 1,000 mL (1,000 mLs Intravenous New Bag/Given 10/18/16 0231)  potassium chloride SA (K-DUR,KLOR-CON) CR tablet 40 mEq (40 mEq Oral Given 10/18/16 0235)     Initial Impression / Assessment and Plan / ED Course  I have reviewed  the triage vital signs and the nursing notes.  Pertinent labs & imaging results that were available during my care of the patient were reviewed by me and considered in my medical decision making (see chart for details).  Clinical Course as of Oct 19 331  Sat Oct 18, 2016  0308 Workup notable for ketones in the urine suggestive of dehydration. She does have a leukocytosis but appears hemoconcentrated. Creatinine mildly elevated. Likely related to dehydration. Mild hypokalemia which she has had in the past. Patient given 2 L of fluid. She is currently tolerating fluids. If able to tolerate fluids and hydrate,  feel she can be discharged home as the rest of her workup is reassuring.  [CH]    Clinical Course User Index [CH] Wilburn Keir, Mayer Masker, MD    Patient presents with vomiting. History of the same with no known diagnosis. She is nontoxic-appearing. Mildly tachycardic but otherwise vital signs reassuring. Mucous membranes are dry. Patient was given fluids and nausea medication. Lab work notable for mild hypokalemia. See clinical course above. Patient was able to tolerate fluids and requesting Reglan refill. She is followed closely with her primary physician and GI doctor.  After history, exam, and medical workup I feel the patient has been appropriately medically screened and is safe for discharge home. Pertinent diagnoses were discussed with the patient. Patient was given return precautions.   Final Clinical Impressions(s) / ED Diagnoses   Final diagnoses:  Emesis, persistent  Dehydration  Hypokalemia    New Prescriptions Current Discharge Medication List       Shon Baton, MD 10/18/16 [phone]

## 2016-10-18 NOTE — ED Notes (Signed)
RN called pt for triage. No response.

## 2016-10-18 NOTE — Discharge Instructions (Signed)
Please take reglan as needed for vomiting.  You can also use the zofran odt.  Drink oral rehydration fluid as discussed.

## 2016-10-18 NOTE — Discharge Instructions (Signed)
You were seen today for vomiting. You were noted to be high dehydrated. You have had an extensive workup for vomiting in the past. Your workup here is reassuring with the exception of dehydration and mild potassium.  Follow-up closely with her primary physician. Make sure to hydrate aggressively.

## 2016-10-18 NOTE — ED Notes (Signed)
Pt is in stable condition upon d/c and ambulates from ED. 

## 2016-10-18 NOTE — ED Provider Notes (Signed)
[facility] DEPT Provider Note   CSN: [id number] Arrival date & time: 10/18/16  5790     History   Chief Complaint Chief Complaint  Patient presents with  . Emesis    HPI Debra Thornton is a 21 y.o. female.  HPI  -year-old female with nausea and vomiting that began yesterday. She has vomited multiple times. She has had recurrent episodes of this. She states she has had some blood in her emesis this a.m. She was seen at [facility] [place] today. She left there in the early a.m. She has not taken the Reglan that was prescribed. She has had similar symptoms in the past and has a workup that included endoscopy and gastric emptying study. Abdominal ultrasound 03/08/2016 showed no acute abnormalities.  CT abdomen pelvis of 02/17/2016 showed no acute findings.  Lab work from earlier today showed a mild hypokalemia with a past [place] met 3.1. Blood sugar is normal. Liver function tests are normal. Lipase is normal. She had mildly elevated white blood cell count at 13,900.  Past Medical History:  Diagnosis Date  . Colitis   . Esophageal yeast infection ([place])   . Pancreatitis     Patient Active Problem List   Diagnosis Date Noted  . Hypokalemia 03/10/2016  . Acute kidney injury ([place]) 03/10/2016  . Nausea and vomiting 03/08/2016    Past Surgical History:  Procedure Laterality Date  . NO PAST SURGERIES      OB History    No data available       Home Medications    Prior to Admission medications   Medication Sig Start Date End Date Taking? Authorizing Provider  EPINEPHrine (AUVI-Q) 0.3 mg/0.3 mL IJ SOAJ injection Inject 0.3 mLs (0.3 mg total) into the muscle once. 09/25/16 09/25/16  Valentina Shaggy, MD  LORazepam (ATIVAN) 1 MG tablet Take 1 tablet (1 mg total) by mouth 2 (two) times daily as needed (Vomiting). 03/10/16   Mariel Aloe, MD  metoCLOPramide (REGLAN) 10 MG tablet Take 1 tablet (10 mg total) by mouth every 6 (six) hours as needed for nausea or vomiting. 10/18/16    Horton, Barbette Hair, MD  norelgestromin-ethinyl estradiol (ORTHO EVRA) 150-35 MCG/24HR transdermal patch Place 1 patch onto the skin once a week.    [provider]  polyethylene glycol powder (GLYCOLAX/MIRALAX) powder  08/05/16   [provider]  potassium chloride SA (K-DUR,KLOR-CON) 20 MEQ tablet Take 2 tablets (40 mEq total) by mouth daily. 10/18/16   Horton, Barbette Hair, MD  promethazine (PHENERGAN) 25 MG tablet Take 1 tablet (25 mg total) by mouth every 6 (six) hours as needed for nausea or vomiting. 06/28/16   Molpus, Jenny Reichmann, MD    Family History Family History  Problem Relation Age of Onset  . Allergic rhinitis Father   . Asthma Father   . Urticaria Father   . Asthma Paternal Aunt   . Eczema Paternal Uncle   . Asthma Paternal Grandfather     Social History Social History  Substance Use Topics  . Smoking status: Former Smoker    Types: Cigarettes  . Smokeless tobacco: Never Used  . Alcohol use No     Allergies   Patient has no known allergies.   Review of Systems Review of Systems   Physical Exam Updated Vital Signs BP 113/67   Pulse 63   Temp 100.1 F (37.8 C) (Oral)   Resp 20   LMP 10/11/2016   SpO2 99%   Physical Exam  Constitutional:  She is oriented to person, place, and time. She appears well-developed and well-nourished. No distress.  HENT:  Head: Normocephalic and atraumatic.  Right Ear: External ear normal.  Left Ear: External ear normal.  Nose: Nose normal.  Eyes: Conjunctivae and EOM are normal. Pupils are equal, round, and reactive to light.  Neck: Normal range of motion. Neck supple.  Pulmonary/Chest: Effort normal.  Musculoskeletal: Normal range of motion.  Neurological: She is alert and oriented to person, place, and time. She exhibits normal muscle tone. Coordination normal.  Skin: Skin is warm and dry.  Psychiatric: She has a normal mood and affect. Her behavior is normal. Thought content normal.  Nursing note and vitals  reviewed.    ED Treatments / Results  Labs (all labs ordered are listed, but only abnormal results are displayed) Labs Reviewed - No data to display  EKG  EKG Interpretation None       Radiology No results found.  Procedures Procedures (including critical care time)  Medications Ordered in ED Medications  sodium chloride 0.9 % bolus 1,000 mL (1,000 mLs Intravenous New Bag/Given 10/18/16 1009)  prochlorperazine (COMPAZINE) injection 10 mg (10 mg Intravenous Given 10/18/16 1012)     Initial Impression / Assessment and Plan / ED Course  I have reviewed the triage vital signs and the nursing notes.  Pertinent labs & imaging results that were available during my care of the patient were reviewed by me and considered in my medical decision making (see chart for details).     Patient sleeping in bed. On awakening she states that she has drank some of the ginger ale. She feels nauseated but has not vomited here. Patient will have Phenergan added to her medication. She is advised regarding how to orally hydrate. She is advised regarding return precautions and voices understanding.  Final Clinical Impressions(s) / ED Diagnoses   Final diagnoses:  Nausea and vomiting, intractability of vomiting not specified, unspecified vomiting type    New Prescriptions New Prescriptions   No medications on file     Pattricia Boss, MD 10/20/16 2146

## 2016-10-18 NOTE — ED Notes (Signed)
Liquids given to patient.

## 2016-10-18 NOTE — ED Triage Notes (Signed)
Pt presents with c/o vomiting Since Thursday. Pt states she is unable to keep anything down.

## 2016-10-18 NOTE — ED Notes (Signed)
Pt discharged to home with family. NAD.  

## 2016-10-18 NOTE — ED Triage Notes (Signed)
Pt seen at [facility] earlier today for same thing, pt states she feels sick again. Seen by GI for same.

## 2016-10-18 NOTE — ED Triage Notes (Signed)
Pt c/o n/v since Thursday. Denies diarrhea, states "i haven't used the bathroom since Thursday".

## 2016-10-20 ENCOUNTER — Emergency Department (HOSPITAL_COMMUNITY)
Admission: EM | Admit: 2016-10-20 | Discharge: 2016-10-20 | Disposition: A | Discharge status: Home / Self Care | Payer: BLUE CROSS/BLUE SHIELD | Attending: Emergency Medicine | Admitting: Emergency Medicine

## 2016-10-20 ENCOUNTER — Encounter (HOSPITAL_COMMUNITY): Payer: Self-pay | Admitting: Emergency Medicine

## 2016-10-20 DIAGNOSIS — R1115 Cyclical vomiting syndrome unrelated to migraine: Secondary | ICD-10-CM

## 2016-10-20 DIAGNOSIS — Z79899 Other long term (current) drug therapy: Secondary | ICD-10-CM | POA: Insufficient documentation

## 2016-10-20 DIAGNOSIS — G43A0 Cyclical vomiting, not intractable: Secondary | ICD-10-CM | POA: Diagnosis not present

## 2016-10-20 DIAGNOSIS — R112 Nausea with vomiting, unspecified: Secondary | ICD-10-CM | POA: Diagnosis not present

## 2016-10-20 DIAGNOSIS — Z87891 Personal history of nicotine dependence: Secondary | ICD-10-CM | POA: Diagnosis not present

## 2016-10-20 LAB — COMPREHENSIVE METABOLIC PANEL
ALBUMIN: 4.2 g/dL (ref 3.5–5.0)
ALT: 15 U/L (ref 14–54)
AST: 20 U/L (ref 15–41)
Alkaline Phosphatase: 53 U/L (ref 38–126)
Anion gap: 12 (ref 5–15)
BUN: 8 mg/dL (ref 6–20)
CHLORIDE: 104 mmol/L (ref 101–111)
CO2: 20 mmol/L — ABNORMAL LOW (ref 22–32)
Calcium: 9.5 mg/dL (ref 8.9–10.3)
Creatinine, Ser: 0.9 mg/dL (ref 0.44–1.00)
GFR calc Af Amer: >60 mL/min (ref >60)
GFR calc non Af Amer: >60 mL/min (ref >60)
Glucose, Bld: 94 mg/dL (ref 65–99)
POTASSIUM: 3 mmol/L — AB (ref 3.5–5.1)
SODIUM: 136 mmol/L (ref 135–145)
Total Bilirubin: 1.5 mg/dL — ABNORMAL HIGH (ref 0.3–1.2)
Total Protein: 7.5 g/dL (ref 6.5–8.1)

## 2016-10-20 LAB — RAPID URINE DRUG SCREEN, HOSP PERFORMED
Amphetamines: NOT DETECTED
BARBITURATES: NOT DETECTED
Benzodiazepines: NOT DETECTED
COCAINE: NOT DETECTED
Opiates: NOT DETECTED
TETRAHYDROCANNABINOL: POSITIVE — AB

## 2016-10-20 LAB — URINALYSIS, ROUTINE W REFLEX MICROSCOPIC
Bilirubin Urine: NEGATIVE
Glucose, UA: NEGATIVE mg/dL
HGB URINE DIPSTICK: NEGATIVE
KETONES UR: 80 mg/dL — AB
LEUKOCYTES UA: NEGATIVE
Nitrite: NEGATIVE
PROTEIN: 30 mg/dL — AB
Specific Gravity, Urine: 1.027 (ref 1.005–1.030)
pH: 6 (ref 5.0–8.0)

## 2016-10-20 LAB — CBC
HEMATOCRIT: 39.8 % (ref 36.0–46.0)
Hemoglobin: 13.9 g/dL (ref 12.0–15.0)
MCH: 30.2 pg (ref 26.0–34.0)
MCHC: 34.9 g/dL (ref 30.0–36.0)
MCV: 86.3 fL (ref 78.0–100.0)
Platelets: 280 10*3/uL (ref 150–400)
RBC: 4.61 MIL/uL (ref 3.87–5.11)
RDW: 12.7 % (ref 11.5–15.5)
WBC: 9.5 10*3/uL (ref 4.0–10.5)

## 2016-10-20 LAB — LIPASE, BLOOD: LIPASE: 37 U/L (ref 11–51)

## 2016-10-20 LAB — I-STAT BETA HCG BLOOD, ED (MC, WL, AP ONLY)

## 2016-10-20 MED ORDER — SODIUM CHLORIDE 0.9 % IV SOLN
1000.0000 mL | INTRAVENOUS | Status: DC
  Administered 2016-10-20: 1000 mL via INTRAVENOUS

## 2016-10-20 MED ORDER — POTASSIUM CHLORIDE 20 MEQ/15ML (10%) PO SOLN
40.0000 meq | Freq: Once | ORAL | Status: AC
  Administered 2016-10-20: 40 meq via ORAL
  Filled 2016-10-20: qty 30

## 2016-10-20 MED ORDER — POTASSIUM CHLORIDE IN NACL 20-0.9 MEQ/L-% IV SOLN
Freq: Once | INTRAVENOUS | Status: AC
  Administered 2016-10-20: 09:00:00 via INTRAVENOUS
  Filled 2016-10-20: qty 1000

## 2016-10-20 MED ORDER — METOCLOPRAMIDE HCL 5 MG/ML IJ SOLN
10.0000 mg | Freq: Once | INTRAMUSCULAR | Status: AC
  Administered 2016-10-20: 10 mg via INTRAVENOUS
  Filled 2016-10-20: qty 2

## 2016-10-20 MED ORDER — HALOPERIDOL LACTATE 5 MG/ML IJ SOLN
2.0000 mg | Freq: Once | INTRAMUSCULAR | Status: AC
  Administered 2016-10-20: 2 mg via INTRAVENOUS
  Filled 2016-10-20: qty 1

## 2016-10-20 MED ORDER — DIPHENHYDRAMINE HCL 50 MG/ML IJ SOLN
25.0000 mg | Freq: Once | INTRAMUSCULAR | Status: AC
  Administered 2016-10-20: 25 mg via INTRAVENOUS
  Filled 2016-10-20: qty 1

## 2016-10-20 MED ORDER — METOCLOPRAMIDE HCL 10 MG PO TABS: 10.0000 mg | ORAL_TABLET | Freq: Four times a day (QID) | ORAL | 0 refills | Status: DC | PRN

## 2016-10-20 MED ORDER — SODIUM CHLORIDE 0.9 % IV BOLUS (SEPSIS)
1000.0000 mL | Freq: Once | INTRAVENOUS | Status: AC
  Administered 2016-10-20: 1000 mL via INTRAVENOUS

## 2016-10-20 MED ORDER — CAPSICUM OLEORESIN 0.025 % EX CREA
TOPICAL_CREAM | Freq: Once | CUTANEOUS | Status: AC
  Administered 2016-10-20: 11:00:00 via TOPICAL
  Filled 2016-10-20: qty 60

## 2016-10-20 MED ORDER — CAPSAICIN 0.075 % EX CREA
TOPICAL_CREAM | Freq: Once | CUTANEOUS | Status: DC
  Filled 2016-10-20: qty 60

## 2016-10-20 NOTE — ED Notes (Signed)
Pt. Able to tolerate water and crackers at this time. EDP made aware.

## 2016-10-20 NOTE — ED Notes (Signed)
Pt. States she threw up all of the potassium.

## 2016-10-20 NOTE — ED Triage Notes (Signed)
Continued n/v since last week, seen x2 for same. On reglan, zofran with no relief.

## 2016-10-20 NOTE — ED Provider Notes (Signed)
[facility]-EMERGENCY DEPT Provider Note   CSN: [id number] Arrival date & time: 10/20/16  0610     History   Chief Complaint Chief Complaint  Patient presents with  . Nausea    HPI Debra Thornton is a 21 y.o. female.  HPI Patient reports she has had recurrent vomiting and nausea for the past 4 days. No significant amount of abdominal pain. No fever no diarrhea. Patient reports she is feeling generally weak. She reports that if she tries to eat anything, she vomits. This has been a recurrent phenomenon. Has seen gastroenterology and has specialty consultation with allergist. She does have Phenergan and Zofran and Reglan to use at home for nausea control. She denies any specific diagnosis for this recurrent vomiting episodes. Past Medical History:  Diagnosis Date  . Colitis   . Esophageal yeast infection (HCC)   . Pancreatitis     Patient Active Problem List   Diagnosis Date Noted  . Hypokalemia 03/10/2016  . Acute kidney injury (HCC) 03/10/2016  . Nausea and vomiting 03/08/2016    Past Surgical History:  Procedure Laterality Date  . NO PAST SURGERIES      OB History    No data available       Home Medications    Prior to Admission medications   Medication Sig Start Date End Date Taking? Authorizing Provider  metoCLOPramide (REGLAN) 10 MG tablet Take 1 tablet (10 mg total) by mouth every 6 (six) hours as needed for nausea or vomiting. 10/18/16  Yes Horton, Mayer Maskerourtney F, MD  norelgestromin-ethinyl estradiol (ORTHO EVRA) 150-35 MCG/24HR transdermal patch Place 1 patch onto the skin once a week.   Yes [provider]  ondansetron (ZOFRAN ODT) 8 MG disintegrating tablet Take 1 tablet (8 mg total) by mouth every 8 (eight) hours as needed for nausea or vomiting. 10/18/16  Yes Margarita Grizzleay, Danielle, MD  potassium chloride SA (K-DUR,KLOR-CON) 20 MEQ tablet Take 2 tablets (40 mEq total) by mouth daily. 10/18/16  Yes Horton, Mayer Maskerourtney F, MD  EPINEPHrine (AUVI-Q) 0.3 mg/0.3 mL IJ SOAJ  injection Inject 0.3 mLs (0.3 mg total) into the muscle once. 09/25/16 09/25/16  Alfonse SpruceGallagher, Joel Louis, MD  LORazepam (ATIVAN) 1 MG tablet Take 1 tablet (1 mg total) by mouth 2 (two) times daily as needed (Vomiting). Patient not taking: Reported on 10/20/2016 03/10/16   Narda BondsNettey, Ralph A, MD  promethazine (PHENERGAN) 25 MG tablet Take 1 tablet (25 mg total) by mouth every 6 (six) hours as needed for nausea or vomiting. Patient not taking: Reported on 10/20/2016 06/28/16   Molpus, Jonny RuizJohn, MD    Family History Family History  Problem Relation Age of Onset  . Allergic rhinitis Father   . Asthma Father   . Urticaria Father   . Asthma Paternal Aunt   . Eczema Paternal Uncle   . Asthma Paternal Grandfather     Social History Social History  Substance Use Topics  . Smoking status: Former Smoker    Types: Cigarettes  . Smokeless tobacco: Never Used  . Alcohol use No     Allergies   Patient has no known allergies.   Review of Systems Review of Systems 10 Systems reviewed and are negative for acute change except as noted in the HPI.   Physical Exam Updated Vital Signs BP 119/77 (BP Location: Left Arm)   Pulse 75   Temp 98.5 F (36.9 C) (Oral)   Resp 16   Ht 5\' 4"  (1.626 m)   Wt 77.1 kg (170 lb)  LMP 10/11/2016 (Approximate)   SpO2 100%   BMI 29.18 kg/m   Physical Exam  Constitutional: She is oriented to person, place, and time. She appears well-developed and well-nourished. No distress.  Patient appears mildly uncomfortable but is alert and appropriate. Is not expressing significant pain. No respiratory distress.  HENT:  Head: Normocephalic and atraumatic.  Mouth/Throat: Oropharynx is clear and moist.  Eyes: Conjunctivae and EOM are normal. Pupils are equal, round, and reactive to light. No scleral icterus.  Neck: Neck supple.  Cardiovascular: Normal rate and regular rhythm.   No murmur heard. Pulmonary/Chest: Effort normal and breath sounds normal. No respiratory distress.    Abdominal: Soft. There is no tenderness.  Musculoskeletal: She exhibits no edema or tenderness.  Neurological: She is alert and oriented to person, place, and time. No cranial nerve deficit. She exhibits normal muscle tone. Coordination normal.  Skin: Skin is warm and dry.  Psychiatric: She has a normal mood and affect.  Nursing note and vitals reviewed.    ED Treatments / Results  Labs (all labs ordered are listed, but only abnormal results are displayed) Labs Reviewed  COMPREHENSIVE METABOLIC PANEL - Abnormal; Notable for the following:       Result Value   Potassium 3.0 (*)    CO2 20 (*)    Total Bilirubin 1.5 (*)    All other components within normal limits  URINALYSIS, ROUTINE W REFLEX MICROSCOPIC - Abnormal; Notable for the following:    Color, Urine AMBER (*)    APPearance HAZY (*)    Ketones, ur 80 (*)    Protein, ur 30 (*)    Bacteria, UA RARE (*)    Squamous Epithelial / LPF 0-5 (*)    All other components within normal limits  RAPID URINE DRUG SCREEN, HOSP PERFORMED - Abnormal; Notable for the following:    Tetrahydrocannabinol POSITIVE (*)    All other components within normal limits  LIPASE, BLOOD  CBC  I-STAT BETA HCG BLOOD, ED ([facility], [facility], [facility] ONLY)    EKG  EKG Interpretation None       Radiology No results found.  Procedures Procedures (including critical care time)  Medications Ordered in ED Medications  sodium chloride 0.9 % bolus 1,000 mL (0 mLs Intravenous Stopped 10/20/16 0916)    Followed by  0.9 %  sodium chloride infusion (0 mLs Intravenous Stopped 10/20/16 1243)  0.9 % NaCl with KCl 20 mEq/ L  infusion ( Intravenous Stopped 10/20/16 1243)  potassium chloride 20 MEQ/15ML (10%) solution 40 mEq (40 mEq Oral Given 10/20/16 0838)  metoCLOPramide (REGLAN) injection 10 mg (10 mg Intravenous Given 10/20/16 0833)  diphenhydrAMINE (BENADRYL) injection 25 mg (25 mg Intravenous Given 10/20/16 0833)  capsicum oleoresin (TRIXAICIN) 0.025 % cream ( Topical  Given 10/20/16 1038)  haloperidol lactate (HALDOL) injection 2 mg (2 mg Intravenous Given 10/20/16 1038)     Initial Impression / Assessment and Plan / ED Course  I have reviewed the triage vital signs and the nursing notes.  Pertinent labs & imaging results that were available during my care of the patient were reviewed by me and considered in my medical decision making (see chart for details).    Patient had been treated with Reglan, Benadryl and fluids. Capsaicin cream was applied. Patient did begin to experience some improvement but continued to report nausea. Subsequently a 2 mg dose of Haldol was added. After a period of observation, hydration and medication as listed above patient ultimately had good resolution of symptoms and  was able to tolerate by mouth intake.  Final Clinical Impressions(s) / ED Diagnoses   Final diagnoses:  Non-intractable cyclical vomiting with nausea   Patient has had multiple recurrences of similar symptoms. She is nontoxic with nonsurgical abdomen. She has had extensive diagnostic evaluation without specific diagnosis. 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 Patient reports she has tried long holidays from cannabinoids but symptoms did not resolve. She has responded well to typical therapy. At this time she is counseled to desist from all cannabinoids until another etiology is identified. She will continue to follow-up with her specialty and primary care providers. New Prescriptions New Prescriptions   No medications on file     Arby Barrette, MD 10/20/16 1401

## 2016-10-22 DIAGNOSIS — F4324 Adjustment disorder with disturbance of conduct: Secondary | ICD-10-CM | POA: Diagnosis not present

## 2016-11-19 DIAGNOSIS — F4324 Adjustment disorder with disturbance of conduct: Secondary | ICD-10-CM | POA: Diagnosis not present

## 2016-12-04 DIAGNOSIS — F4324 Adjustment disorder with disturbance of conduct: Secondary | ICD-10-CM | POA: Diagnosis not present

## 2016-12-18 ENCOUNTER — Ambulatory Visit: Payer: BLUE CROSS/BLUE SHIELD | Admitting: Allergy & Immunology

## 2017-03-06 DIAGNOSIS — J069 Acute upper respiratory infection, unspecified: Secondary | ICD-10-CM | POA: Diagnosis not present

## 2017-04-12 IMAGING — CR DG ABDOMEN ACUTE W/ 1V CHEST
3 series · 3 of 3 positions shown · non-contrast
Comparison: Abdominal radiograph 03/09/2016.

CLINICAL DATA: Patient with vomiting for the last 24 hours.

EXAM:
DG ABDOMEN ACUTE W/ 1V CHEST

[chest pa]
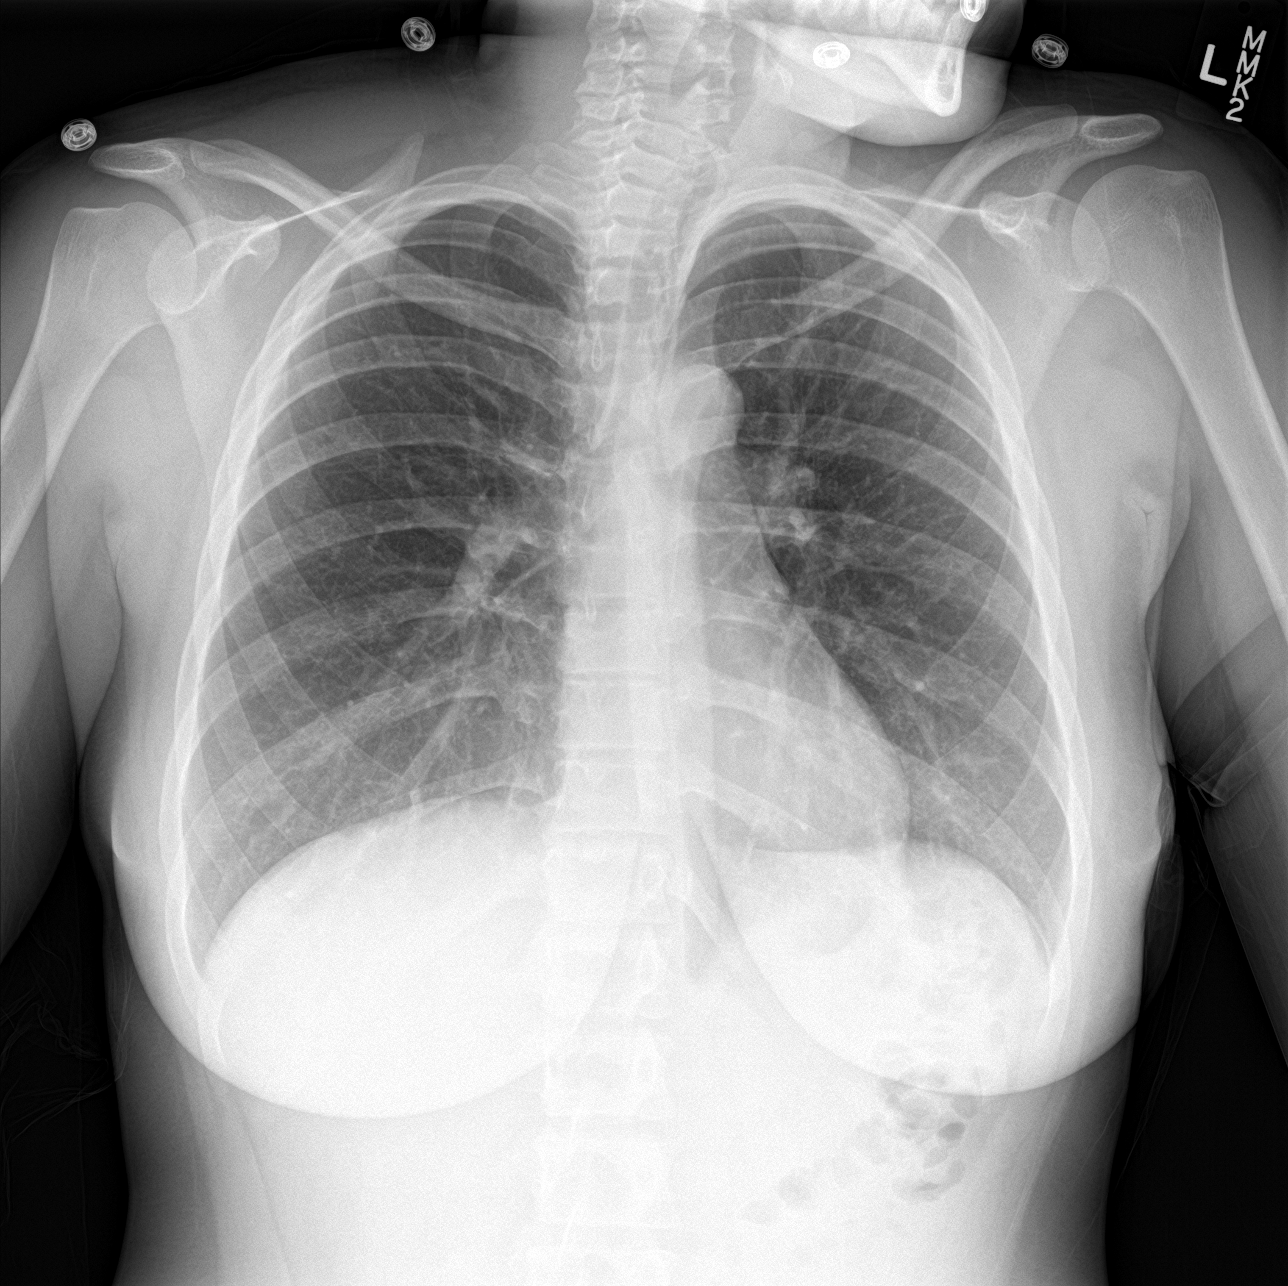

[abdomen erect]
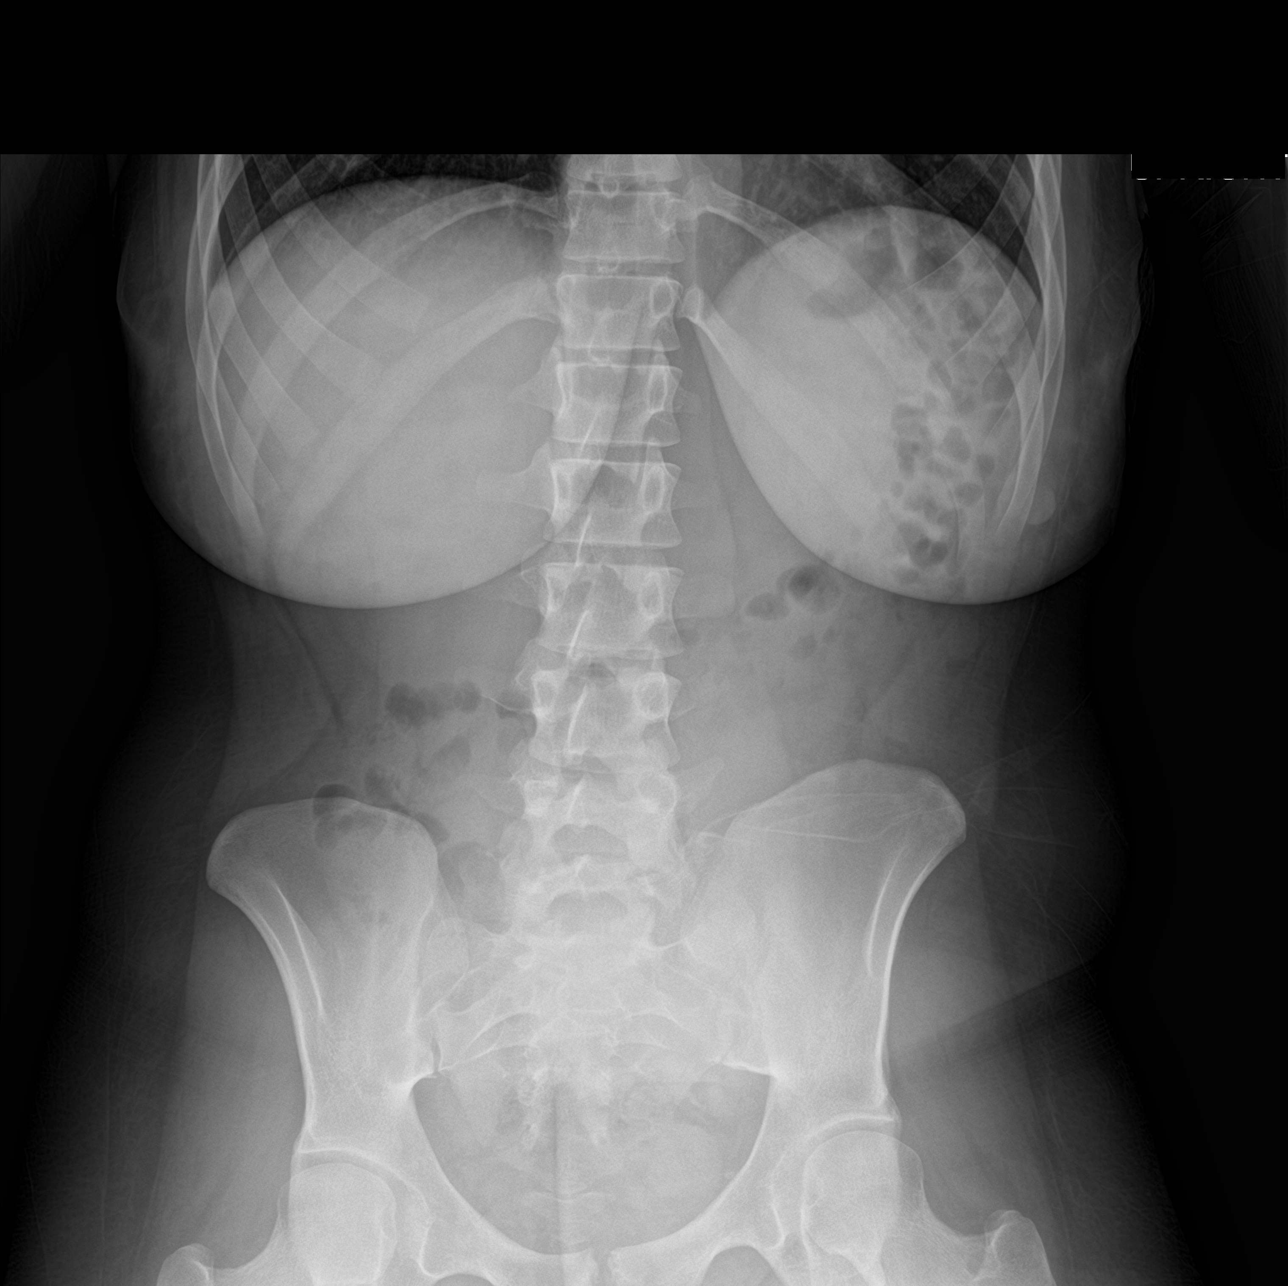

[abdomen supine]
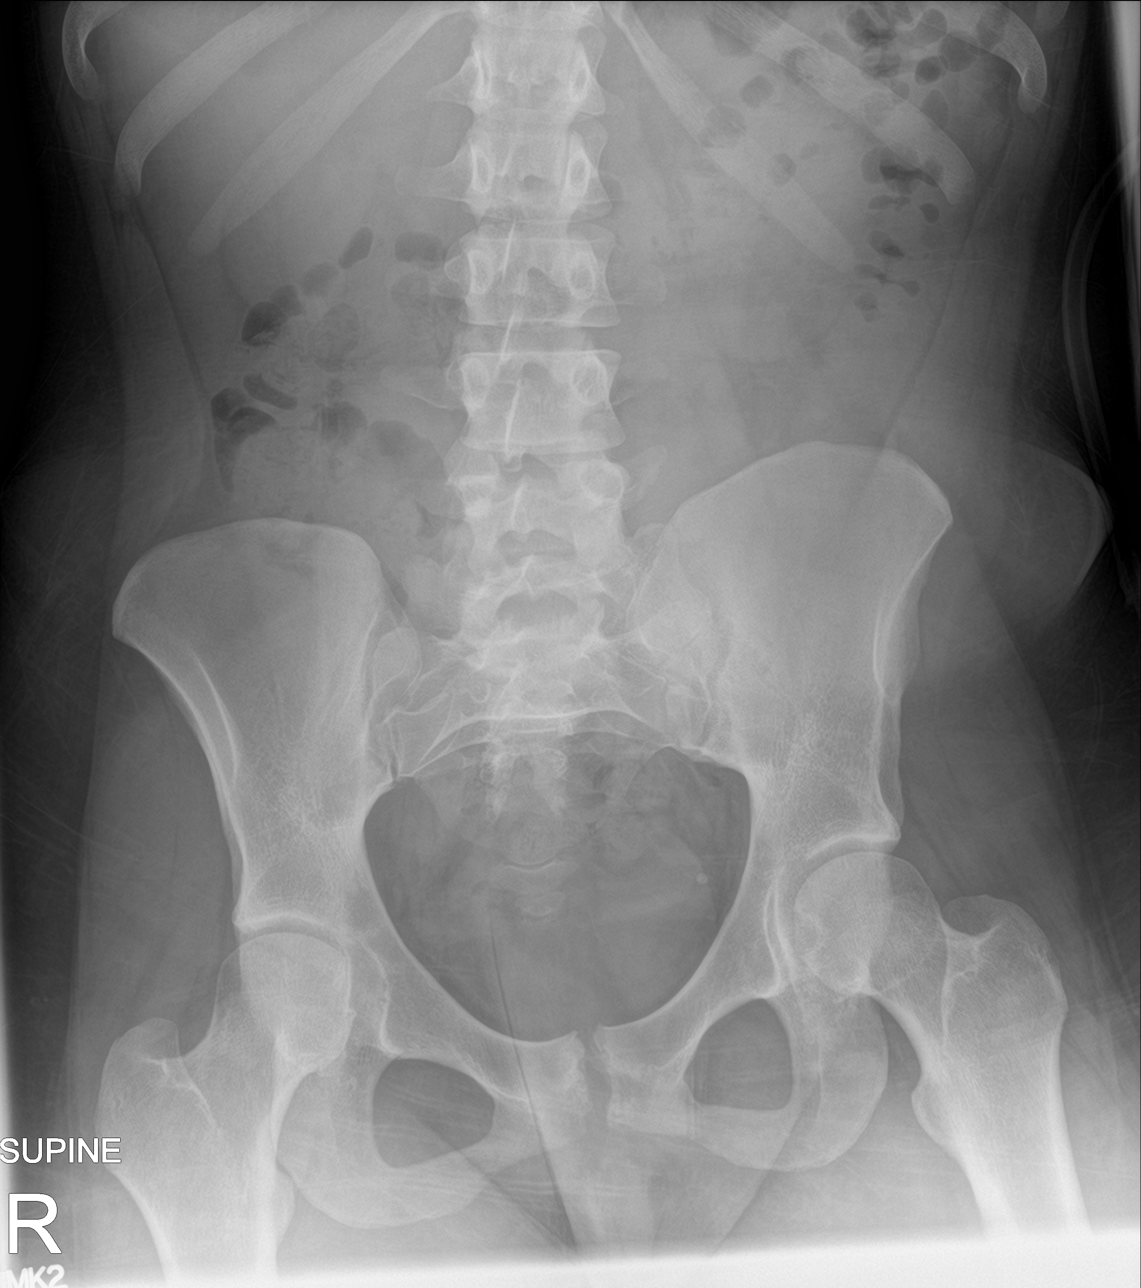

[3 of 3 positions shown; findings below may reference images not displayed]

FINDINGS: Normal cardiac and mediastinal contours. No consolidative pulmonary
opacities. No pleural effusion or pneumothorax.

Gas is demonstrated within nondilated loops of large and small bowel
in a nonobstructed pattern. No evidence for free intraperitoneal
air. Osseous skeleton unremarkable. Suggestion of degenerative
changes at the pubic symphysis.
IMPRESSION: No acute cardiopulmonary process.

Nonobstructed bowel gas pattern.

## 2017-06-23 DIAGNOSIS — K209 Esophagitis, unspecified: Secondary | ICD-10-CM | POA: Diagnosis not present

## 2017-06-23 DIAGNOSIS — Z79899 Other long term (current) drug therapy: Secondary | ICD-10-CM | POA: Diagnosis not present

## 2017-06-23 DIAGNOSIS — R933 Abnormal findings on diagnostic imaging of other parts of digestive tract: Secondary | ICD-10-CM | POA: Diagnosis not present

## 2017-06-23 DIAGNOSIS — R112 Nausea with vomiting, unspecified: Secondary | ICD-10-CM | POA: Diagnosis not present

## 2017-06-23 DIAGNOSIS — F17210 Nicotine dependence, cigarettes, uncomplicated: Secondary | ICD-10-CM | POA: Diagnosis not present

## 2017-06-23 DIAGNOSIS — R1013 Epigastric pain: Secondary | ICD-10-CM | POA: Diagnosis not present

## 2017-06-23 DIAGNOSIS — F1290 Cannabis use, unspecified, uncomplicated: Secondary | ICD-10-CM | POA: Diagnosis not present

## 2017-06-23 DIAGNOSIS — K922 Gastrointestinal hemorrhage, unspecified: Secondary | ICD-10-CM | POA: Diagnosis not present

## 2017-06-23 DIAGNOSIS — K920 Hematemesis: Secondary | ICD-10-CM | POA: Diagnosis not present

## 2017-06-23 DIAGNOSIS — R935 Abnormal findings on diagnostic imaging of other abdominal regions, including retroperitoneum: Secondary | ICD-10-CM | POA: Diagnosis not present

## 2017-06-23 DIAGNOSIS — E876 Hypokalemia: Secondary | ICD-10-CM | POA: Diagnosis not present

## 2017-06-24 DIAGNOSIS — R112 Nausea with vomiting, unspecified: Secondary | ICD-10-CM | POA: Diagnosis not present

## 2017-06-24 DIAGNOSIS — K209 Esophagitis, unspecified: Secondary | ICD-10-CM | POA: Diagnosis not present

## 2017-06-24 DIAGNOSIS — K922 Gastrointestinal hemorrhage, unspecified: Secondary | ICD-10-CM | POA: Diagnosis not present

## 2017-06-25 DIAGNOSIS — K209 Esophagitis, unspecified: Secondary | ICD-10-CM | POA: Diagnosis not present

## 2017-06-25 DIAGNOSIS — K922 Gastrointestinal hemorrhage, unspecified: Secondary | ICD-10-CM | POA: Diagnosis not present

## 2017-11-01 ENCOUNTER — Other Ambulatory Visit: Payer: Self-pay

## 2017-11-01 ENCOUNTER — Encounter (HOSPITAL_BASED_OUTPATIENT_CLINIC_OR_DEPARTMENT_OTHER): Payer: Self-pay | Admitting: Emergency Medicine

## 2017-11-01 ENCOUNTER — Emergency Department (HOSPITAL_BASED_OUTPATIENT_CLINIC_OR_DEPARTMENT_OTHER)
Admission: EM | Admit: 2017-11-01 | Discharge: 2017-11-02 | Disposition: A | Discharge status: Home / Self Care | Payer: BLUE CROSS/BLUE SHIELD | Attending: Emergency Medicine | Admitting: Emergency Medicine

## 2017-11-01 DIAGNOSIS — R112 Nausea with vomiting, unspecified: Secondary | ICD-10-CM | POA: Diagnosis present

## 2017-11-01 DIAGNOSIS — Z87891 Personal history of nicotine dependence: Secondary | ICD-10-CM | POA: Insufficient documentation

## 2017-11-01 DIAGNOSIS — R1115 Cyclical vomiting syndrome unrelated to migraine: Secondary | ICD-10-CM

## 2017-11-01 DIAGNOSIS — Z79899 Other long term (current) drug therapy: Secondary | ICD-10-CM | POA: Diagnosis not present

## 2017-11-01 DIAGNOSIS — G43A0 Cyclical vomiting, not intractable: Secondary | ICD-10-CM | POA: Diagnosis not present

## 2017-11-01 DIAGNOSIS — R1084 Generalized abdominal pain: Secondary | ICD-10-CM | POA: Insufficient documentation

## 2017-11-01 DIAGNOSIS — R109 Unspecified abdominal pain: Secondary | ICD-10-CM | POA: Diagnosis not present

## 2017-11-01 DIAGNOSIS — R110 Nausea: Secondary | ICD-10-CM

## 2017-11-01 LAB — RAPID URINE DRUG SCREEN, HOSP PERFORMED
Amphetamines: NOT DETECTED
BENZODIAZEPINES: NOT DETECTED
COCAINE: NOT DETECTED
Opiates: NOT DETECTED
Tetrahydrocannabinol: POSITIVE — AB

## 2017-11-01 LAB — COMPREHENSIVE METABOLIC PANEL
ALT: 23 U/L (ref 0–44)
ANION GAP: 12 (ref 5–15)
AST: 37 U/L (ref 15–41)
Albumin: 4.4 g/dL (ref 3.5–5.0)
Alkaline Phosphatase: 62 U/L (ref 38–126)
BUN: 10 mg/dL (ref 6–20)
CHLORIDE: 112 mmol/L — AB (ref 98–111)
CO2: 19 mmol/L — ABNORMAL LOW (ref 22–32)
Calcium: 9.2 mg/dL (ref 8.9–10.3)
Creatinine, Ser: 0.78 mg/dL (ref 0.44–1.00)
GFR calc non Af Amer: >60 mL/min (ref >60)
Glucose, Bld: 114 mg/dL — ABNORMAL HIGH (ref 70–99)
POTASSIUM: 3.5 mmol/L (ref 3.5–5.1)
Sodium: 143 mmol/L (ref 135–145)
Total Bilirubin: 0.5 mg/dL (ref 0.3–1.2)
Total Protein: 7.9 g/dL (ref 6.5–8.1)

## 2017-11-01 LAB — URINALYSIS, COMPLETE (UACMP) WITH MICROSCOPIC
BILIRUBIN URINE: NEGATIVE
GLUCOSE, UA: NEGATIVE mg/dL
Ketones, ur: NEGATIVE mg/dL
LEUKOCYTES UA: NEGATIVE
NITRITE: NEGATIVE
PH: 7.5 (ref 5.0–8.0)
Protein, ur: NEGATIVE mg/dL
Specific Gravity, Urine: 1.015 (ref 1.005–1.030)

## 2017-11-01 LAB — URINALYSIS, ROUTINE W REFLEX MICROSCOPIC
Bilirubin Urine: NEGATIVE
GLUCOSE, UA: NEGATIVE mg/dL
Ketones, ur: 15 mg/dL — AB
Nitrite: NEGATIVE
PH: 8.5 — AB (ref 5.0–8.0)
PROTEIN: 30 mg/dL — AB
Specific Gravity, Urine: 1.015 (ref 1.005–1.030)

## 2017-11-01 LAB — CBC
HEMATOCRIT: 37.1 % (ref 36.0–46.0)
HEMOGLOBIN: 13.2 g/dL (ref 12.0–15.0)
MCH: 31.8 pg (ref 26.0–34.0)
MCHC: 35.6 g/dL (ref 30.0–36.0)
MCV: 89.4 fL (ref 78.0–100.0)
PLATELETS: 232 10*3/uL (ref 150–400)
RBC: 4.15 MIL/uL (ref 3.87–5.11)
RDW: 12.2 % (ref 11.5–15.5)
WBC: 17.7 10*3/uL — AB (ref 4.0–10.5)

## 2017-11-01 LAB — URINALYSIS, MICROSCOPIC (REFLEX): RBC / HPF: >50 RBC/hpf (ref 0–5)

## 2017-11-01 LAB — PREGNANCY, URINE: Preg Test, Ur: NEGATIVE

## 2017-11-01 LAB — LIPASE, BLOOD: LIPASE: 24 U/L (ref 11–51)

## 2017-11-01 MED ORDER — SODIUM CHLORIDE 0.9 % IV BOLUS
1000.0000 mL | Freq: Once | INTRAVENOUS | Status: AC
  Administered 2017-11-01: 1000 mL via INTRAVENOUS

## 2017-11-01 MED ORDER — LORAZEPAM 2 MG/ML IJ SOLN
1.0000 mg | Freq: Once | INTRAMUSCULAR | Status: AC
Start: 2017-11-01 — End: 2017-11-01
  Administered 2017-11-01: 1 mg via INTRAVENOUS
  Filled 2017-11-01: qty 1

## 2017-11-01 MED ORDER — HALOPERIDOL LACTATE 5 MG/ML IJ SOLN
2.0000 mg | Freq: Once | INTRAMUSCULAR | Status: AC
  Administered 2017-11-01: 2 mg via INTRAVENOUS
  Filled 2017-11-01: qty 1

## 2017-11-01 NOTE — ED Notes (Signed)
ED Provider at bedside. 

## 2017-11-01 NOTE — ED Notes (Signed)
Pt sat up in bed and vomited.  Gave pt new emesis bag and pt began ambulating but had to go to trash can where she had much dry heaves.  Helped pt back to stretcher and notified PA or pt symptoms, await further orders

## 2017-11-01 NOTE — ED Notes (Signed)
Pt attempting urine sample again

## 2017-11-01 NOTE — ED Provider Notes (Signed)
[facility] EMERGENCY DEPARTMENT Provider Note   CSN: [id number] Arrival date & time: 11/01/17  1806     History   Chief Complaint Chief Complaint  Patient presents with  . Abdominal Pain    HPI Debra Thornton is a 22 y.o. female.  22 y/o female with a PMH of cyclical vomiting syndrome presents to the ED with vomiting, nausea since this morning.Patient states she drank a whole bottle of 1900 took an estimate of 6 shots. Patient has tried to eat some mashed potatoes, Pedialyte, and Kool aid and has had no relieve in symptoms.Patient was seen in February and diagnosed with Cannabis hyperemesis syndrome concurrent with and due to cannabis abuse.Patient was placed on metoclopramide and has took some today but has not help her vomiting. She denies any chest pain, SOB, or fever.      Past Medical History:  Diagnosis Date  . Colitis   . Esophageal yeast infection (HCC)   . Pancreatitis     Patient Active Problem List   Diagnosis Date Noted  . Hypokalemia 03/10/2016  . Acute kidney injury (HCC) 03/10/2016  . Nausea and vomiting 03/08/2016    Past Surgical History:  Procedure Laterality Date  . NO PAST SURGERIES       OB History   None      Home Medications    Prior to Admission medications   Medication Sig Start Date End Date Taking? Authorizing Provider  EPINEPHrine (AUVI-Q) 0.3 mg/0.3 mL IJ SOAJ injection Inject 0.3 mLs (0.3 mg total) into the muscle once. 09/25/16 09/25/16  Alfonse Spruce, MD  LORazepam (ATIVAN) 1 MG tablet Take 1 tablet (1 mg total) by mouth 3 (three) times daily as needed for up to 5 days. 11/02/17 11/07/17  Claude Manges, PA-C  metoCLOPramide (REGLAN) 10 MG tablet Take 1 tablet (10 mg total) by mouth every 6 (six) hours as needed for nausea or vomiting. 10/18/16   Horton, Mayer Masker, MD  metoCLOPramide (REGLAN) 10 MG tablet Take 1 tablet (10 mg total) by mouth every 6 (six) hours as needed for nausea (nausea/headache). 10/20/16   Arby Barrette, MD  norelgestromin-ethinyl estradiol (ORTHO EVRA) 150-35 MCG/24HR transdermal patch Place 1 patch onto the skin once a week.    [provider]  ondansetron (ZOFRAN ODT) 8 MG disintegrating tablet Take 1 tablet (8 mg total) by mouth every 8 (eight) hours as needed for nausea or vomiting. 10/18/16   Margarita Grizzle, MD  potassium chloride SA (K-DUR,KLOR-CON) 20 MEQ tablet Take 2 tablets (40 mEq total) by mouth daily. 10/18/16   Horton, Mayer Masker, MD  promethazine (PHENERGAN) 25 MG tablet Take 1 tablet (25 mg total) by mouth every 6 (six) hours as needed for nausea or vomiting. Patient not taking: Reported on 10/20/2016 06/28/16   Molpus, Jonny Ruiz, MD    Family History Family History  Problem Relation Age of Onset  . Allergic rhinitis Father   . Asthma Father   . Urticaria Father   . Asthma Paternal Aunt   . Eczema Paternal Uncle   . Asthma Paternal Grandfather     Social History Social History   Tobacco Use  . Smoking status: Former Smoker    Types: Cigarettes  . Smokeless tobacco: Never Used  Substance Use Topics  . Alcohol use: No  . Drug use: Yes    Types: Marijuana     Allergies   Patient has no known allergies.   Review of Systems Review of Systems  Constitutional: Negative for  fever.  Respiratory: Negative for chest tightness.   Cardiovascular: Negative for chest pain and palpitations.  Gastrointestinal: Positive for abdominal pain, nausea (has taken some metoclopramide no relieve) and vomiting (describes vomiting as dark brown). Negative for diarrhea.  Genitourinary: Negative for dysuria and flank pain.  Musculoskeletal: Negative for back pain and myalgias.  Neurological: Negative for syncope, light-headedness and headaches.  All other systems reviewed and are negative.    Physical Exam Updated Vital Signs BP 133/72 (BP Location: Right Arm)   Pulse (!) 102   Temp 99.8 F (37.7 C) (Oral)   Resp 16   Ht 5\' 4"  (1.626 m)   Wt 84 kg (185 lb 3 oz)    LMP 11/01/2017   SpO2 100%   BMI 31.79 kg/m   Physical Exam  Constitutional: She appears well-developed and well-nourished.  HENT:  Head: Normocephalic and atraumatic.  Cardiovascular: Normal heart sounds.  Pulmonary/Chest: Effort normal and breath sounds normal. No respiratory distress. She has no wheezes.  Abdominal: Soft. Normal appearance and bowel sounds are normal. There is no tenderness (generalized). There is no tenderness at McBurney's point and negative Murphy's sign.  Musculoskeletal:       Cervical back: Normal.       Thoracic back: Normal.       Lumbar back: Normal.  Nursing note and vitals reviewed.    ED Treatments / Results  Labs (all labs ordered are listed, but only abnormal results are displayed) Labs Reviewed  COMPREHENSIVE METABOLIC PANEL - Abnormal; Notable for the following components:      Result Value   Chloride 112 (*)    CO2 19 (*)    Glucose, Bld 114 (*)    All other components within normal limits  CBC - Abnormal; Notable for the following components:   WBC 17.7 (*)    All other components within normal limits  URINALYSIS, ROUTINE W REFLEX MICROSCOPIC - Abnormal; Notable for the following components:   Color, Urine AMBER (*)    APPearance CLOUDY (*)    pH 8.5 (*)    Hgb urine dipstick LARGE (*)    Ketones, ur 15 (*)    Protein, ur 30 (*)    Leukocytes, UA TRACE (*)    All other components within normal limits  RAPID URINE DRUG SCREEN, HOSP PERFORMED - Abnormal; Notable for the following components:   Tetrahydrocannabinol POSITIVE (*)    Barbiturates   (*)    Value: Result not available. Reagent lot number recalled by manufacturer.   All other components within normal limits  URINALYSIS, MICROSCOPIC (REFLEX) - Abnormal; Notable for the following components:   Bacteria, UA MANY (*)    All other components within normal limits  URINALYSIS, COMPLETE (UACMP) WITH MICROSCOPIC - Abnormal; Notable for the following components:   Hgb urine  dipstick MODERATE (*)    Bacteria, UA FEW (*)    All other components within normal limits  LIPASE, BLOOD  PREGNANCY, URINE    EKG EKG Interpretation  Date/Time:  Sunday November 01 2017 19:10:18 EDT Ventricular Rate:  98 PR Interval:    QRS Duration: 92 QT Interval:  381 QTC Calculation: 487 R Axis:   65 Text Interpretation:  Sinus rhythm Borderline T abnormalities, anterior leads Borderline prolonged QT interval since last tracing no significant change Confirmed by Rolan BuccoBelfi, Melanie [phone]) on 11/01/2017 8:16:23 PM   Radiology No results found.  Procedures Procedures (including critical care time)  Medications Ordered in ED Medications  sodium chloride 0.9 % bolus 1,000  mL (0 mLs Intravenous Stopped 11/01/17 2148)  haloperidol lactate (HALDOL) injection 2 mg (2 mg Intravenous Given 11/01/17 2020)  LORazepam (ATIVAN) injection 1 mg (1 mg Intravenous Given 11/01/17 2251)  sodium chloride 0.9 % bolus 1,000 mL (0 mLs Intravenous Stopped 11/02/17 0015)     Initial Impression / Assessment and Plan / ED Course  I have reviewed the triage vital signs and the nursing notes.  Pertinent labs & imaging results that were available during my care of the patient were reviewed by me and considered in my medical decision making (see chart for details).     Patient presents to the ED complaining of abd pain, nausea and vomiting that began this morning.Patient states she drank a bottle of 1900 alcohol (47% proof) according to patient. She states the voit is a clear brown. Patient was diagnosed with  Cannabis hyperemesis syndrome and was prescribed metoclopramide which she took today with no relieve in symptoms. Labs have been ordered, will hydrate patient and obtain an ekg prior to giving haldol.  EKG shows mild borderline prolonged QT interval, will give 2g of haldol once.Advised patient to not take metoclopramide. Lab results show some mild bacteria and RbC's, patient is currently on her menstrual  cycle. Patient comfortable resting and stating she feels much better.Patient has been told she suffered from hyperemesis cannabis syndrome.  10:44 PMTold per RN patient vomited while sitting up on the bed, spoke to my attending physician Belfi on the course of action will try her on ativan 1mg .   11:30 PM Patient sleeping and resting, patient has been drinking and will be tried on PO challenge.   12:05 AM Patient is tolerating fluids and saltine crackers at this time.States she feels much better at this time. Will send home with ativan 10 tablets and follow up with PCP. I have advised patient to discontinue cannabis use which might help relieve her symptoms.         Final Clinical Impressions(s) / ED Diagnoses   Final diagnoses:  Non-intractable cyclical vomiting with nausea  Nausea    ED Discharge Orders        Ordered    LORazepam (ATIVAN) 1 MG tablet  3 times daily PRN     11/02/17 0012       Claude Manges, PA-C 11/02/17 0020    Rolan Bucco, MD 11/04/17 [phone]

## 2017-11-01 NOTE — ED Triage Notes (Signed)
Patient states that she has been throwing up all day  - the patient states that she is throwing up bile and green stuff now. She has diffuse abdominal pain

## 2017-11-02 MED ORDER — LORAZEPAM 1 MG PO TABS: 1.0000 mg | ORAL_TABLET | Freq: Three times a day (TID) | ORAL | 0 refills | Status: AC | PRN

## 2017-11-02 NOTE — ED Notes (Signed)
Pt tolerating po fluids

## 2017-11-02 NOTE — Discharge Instructions (Signed)
Today you were seen for non-intractable cyclical vomiting with nausea. Medication was prescribed please take as  needed for nausea. Return to the ED if symptoms worsen.  Get help right away if: You have blood in your vomit. Your vomit looks like coffee grounds. You have stools that are bloody or black, or stools that look like tar. You have signs of dehydration, such as: Sunken eyes. Not making tears while crying. Very dry mouth. Cracked lips. Decreased urine production. Dark urine. Urine may be the color of tea. Weakness. Sleepiness.

## 2018-02-21 ENCOUNTER — Emergency Department (HOSPITAL_BASED_OUTPATIENT_CLINIC_OR_DEPARTMENT_OTHER)
Admission: EM | Admit: 2018-02-21 | Discharge: 2018-02-21 | Disposition: A | Discharge status: Home / Self Care | Payer: BLUE CROSS/BLUE SHIELD | Attending: Emergency Medicine | Admitting: Emergency Medicine

## 2018-02-21 ENCOUNTER — Encounter (HOSPITAL_BASED_OUTPATIENT_CLINIC_OR_DEPARTMENT_OTHER): Payer: Self-pay | Admitting: *Deleted

## 2018-02-21 ENCOUNTER — Other Ambulatory Visit: Payer: Self-pay

## 2018-02-21 ENCOUNTER — Emergency Department (HOSPITAL_BASED_OUTPATIENT_CLINIC_OR_DEPARTMENT_OTHER): Payer: BLUE CROSS/BLUE SHIELD

## 2018-02-21 DIAGNOSIS — G43A0 Cyclical vomiting, not intractable: Secondary | ICD-10-CM | POA: Diagnosis not present

## 2018-02-21 DIAGNOSIS — E876 Hypokalemia: Secondary | ICD-10-CM | POA: Insufficient documentation

## 2018-02-21 DIAGNOSIS — R109 Unspecified abdominal pain: Secondary | ICD-10-CM | POA: Diagnosis not present

## 2018-02-21 DIAGNOSIS — R1115 Cyclical vomiting syndrome unrelated to migraine: Secondary | ICD-10-CM

## 2018-02-21 DIAGNOSIS — Z79899 Other long term (current) drug therapy: Secondary | ICD-10-CM | POA: Insufficient documentation

## 2018-02-21 DIAGNOSIS — Z87891 Personal history of nicotine dependence: Secondary | ICD-10-CM | POA: Diagnosis not present

## 2018-02-21 DIAGNOSIS — R112 Nausea with vomiting, unspecified: Secondary | ICD-10-CM | POA: Diagnosis present

## 2018-02-21 LAB — COMPREHENSIVE METABOLIC PANEL
ALK PHOS: 48 U/L (ref 38–126)
ALT: 14 U/L (ref 0–44)
AST: 25 U/L (ref 15–41)
Albumin: 4.1 g/dL (ref 3.5–5.0)
Anion gap: 10 (ref 5–15)
BUN: 11 mg/dL (ref 6–20)
CALCIUM: 8.8 mg/dL — AB (ref 8.9–10.3)
CHLORIDE: 111 mmol/L (ref 98–111)
CO2: 20 mmol/L — AB (ref 22–32)
CREATININE: 0.73 mg/dL (ref 0.44–1.00)
GFR calc Af Amer: >60 mL/min (ref >60)
GFR calc non Af Amer: >60 mL/min (ref >60)
Glucose, Bld: 129 mg/dL — ABNORMAL HIGH (ref 70–99)
Potassium: 3.1 mmol/L — ABNORMAL LOW (ref 3.5–5.1)
SODIUM: 141 mmol/L (ref 135–145)
Total Bilirubin: 0.3 mg/dL (ref 0.3–1.2)
Total Protein: 7.2 g/dL (ref 6.5–8.1)

## 2018-02-21 LAB — RAPID URINE DRUG SCREEN, HOSP PERFORMED
AMPHETAMINES: NOT DETECTED
Barbiturates: NOT DETECTED
Benzodiazepines: NOT DETECTED
Cocaine: NOT DETECTED
OPIATES: NOT DETECTED
Tetrahydrocannabinol: POSITIVE — AB

## 2018-02-21 LAB — URINALYSIS, ROUTINE W REFLEX MICROSCOPIC
Bilirubin Urine: NEGATIVE
Glucose, UA: NEGATIVE mg/dL
KETONES UR: 15 mg/dL — AB
Nitrite: NEGATIVE
PH: 7 (ref 5.0–8.0)
Protein, ur: 30 mg/dL — AB
SPECIFIC GRAVITY, URINE: 1.025 (ref 1.005–1.030)

## 2018-02-21 LAB — URINALYSIS, MICROSCOPIC (REFLEX)

## 2018-02-21 LAB — CBC
HEMATOCRIT: 37.5 % (ref 36.0–46.0)
HEMOGLOBIN: 12.3 g/dL (ref 12.0–15.0)
MCH: 31 pg (ref 26.0–34.0)
MCHC: 32.8 g/dL (ref 30.0–36.0)
MCV: 94.5 fL (ref 80.0–100.0)
Platelets: 209 10*3/uL (ref 150–400)
RBC: 3.97 MIL/uL (ref 3.87–5.11)
RDW: 12.2 % (ref 11.5–15.5)
WBC: 18.3 10*3/uL — ABNORMAL HIGH (ref 4.0–10.5)
nRBC: 0 % (ref 0.0–0.2)

## 2018-02-21 LAB — PREGNANCY, URINE: Preg Test, Ur: NEGATIVE

## 2018-02-21 LAB — LIPASE, BLOOD: LIPASE: 24 U/L (ref 11–51)

## 2018-02-21 MED ORDER — HALOPERIDOL LACTATE 5 MG/ML IJ SOLN
2.5000 mg | Freq: Once | INTRAMUSCULAR | Status: AC
  Administered 2018-02-21: 2.5 mg via INTRAVENOUS
  Filled 2018-02-21: qty 1

## 2018-02-21 MED ORDER — LORAZEPAM 1 MG PO TABS: 1.0000 mg | ORAL_TABLET | Freq: Three times a day (TID) | ORAL | 0 refills | Status: DC

## 2018-02-21 MED ORDER — POTASSIUM CHLORIDE ER 10 MEQ PO TBCR: 40.0000 meq | EXTENDED_RELEASE_TABLET | Freq: Every day | ORAL | 0 refills | Status: DC

## 2018-02-21 MED ORDER — SODIUM CHLORIDE 0.9 % IV BOLUS
1000.0000 mL | Freq: Once | INTRAVENOUS | Status: AC
Start: 2018-02-21 — End: 2018-02-21
  Administered 2018-02-21: 1000 mL via INTRAVENOUS

## 2018-02-21 MED ORDER — IOPAMIDOL (ISOVUE-300) INJECTION 61%
100.0000 mL | Freq: Once | INTRAVENOUS | Status: AC | PRN
  Administered 2018-02-21: 100 mL via INTRAVENOUS

## 2018-02-21 MED ORDER — FAMOTIDINE IN NACL 20-0.9 MG/50ML-% IV SOLN
20.0000 mg | Freq: Once | INTRAVENOUS | Status: AC
  Administered 2018-02-21: 20 mg via INTRAVENOUS
  Filled 2018-02-21: qty 50

## 2018-02-21 MED ORDER — LORAZEPAM 2 MG/ML IJ SOLN
1.0000 mg | Freq: Once | INTRAMUSCULAR | Status: AC
  Administered 2018-02-21: 1 mg via INTRAVENOUS
  Filled 2018-02-21: qty 1

## 2018-02-21 MED ORDER — LORAZEPAM 1 MG PO TABS: 1.0000 mg | ORAL_TABLET | Freq: Three times a day (TID) | ORAL | 0 refills | Status: AC

## 2018-02-21 MED ORDER — PANTOPRAZOLE SODIUM 40 MG IV SOLR
40.0000 mg | Freq: Once | INTRAVENOUS | Status: AC
  Administered 2018-02-21: 40 mg via INTRAVENOUS
  Filled 2018-02-21: qty 40

## 2018-02-21 NOTE — ED Notes (Signed)
ED Provider at bedside. 

## 2018-02-21 NOTE — Discharge Instructions (Addendum)
You were seen in the ER for nausea, vomiting and upper abdominal pain.  CT did not show any new findings.  The cause of your symptoms is unclear.  It could have been from something you drink, ate or a virus.  It is important you follow-up with your gastroenterologist for further management of your recurrent symptoms.  The emergency department cannot continue to prescribe controlled substance nausea medication.  I have given you 3 pills of Ativan to help you over the next 24 hours.  Your potassium was slightly low.  Take potassium supplements, increase potassium rich foods.   Return to the ER for chest pain, shortness of breath coughing up blood, persistent vomiting despite medications, inability to tolerate fluids, worsening abdominal pain, blood in her stools.

## 2018-02-21 NOTE — ED Provider Notes (Addendum)
[facility] EMERGENCY DEPARTMENT Provider Note   CSN: [id number] Arrival date & time: 02/21/18  1152     History   Chief Complaint Chief Complaint  Patient presents with  . Emesis    HPI Debra Thornton is a 22 y.o. female with history of cyclical vomiting, pancreatitis, here for evaluation of nausea and vomiting, sudden onset around 7:30 AM today.  Reports vomiting up to 20 times, bloody in appearance.  Associated with chills and upper epigastric abdominal pain.  She denies any fevers, chest pain, shortness of breath, dysuria, hematuria, changes in bowel movements, cold symptoms.  No sick contacts.  She had frozen pizza and will be ate for dinner last night around 6 PM.  No history of GI bleed.  States she is not smoked marijuana in close to 2 months and does not use any EtOH.  No interventions for symptoms.  No alleviating factors.  No aggravating factors.  No previous history of abdominal surgeries.  HPI  Past Medical History:  Diagnosis Date  . Colitis   . Esophageal yeast infection (HCC)   . Pancreatitis     Patient Active Problem List   Diagnosis Date Noted  . Hypokalemia 03/10/2016  . Acute kidney injury (HCC) 03/10/2016  . Nausea and vomiting 03/08/2016    Past Surgical History:  Procedure Laterality Date  . NO PAST SURGERIES       OB History   None      Home Medications    Prior to Admission medications   Medication Sig Start Date End Date Taking? Authorizing Provider  EPINEPHrine (AUVI-Q) 0.3 mg/0.3 mL IJ SOAJ injection Inject 0.3 mLs (0.3 mg total) into the muscle once. 09/25/16 09/25/16  Alfonse Spruce, MD  LORazepam (ATIVAN) 1 MG tablet Take 1 tablet (1 mg total) by mouth every 8 (eight) hours for 1 day. 02/21/18 02/22/18  Liberty Handy, PA-C  metoCLOPramide (REGLAN) 10 MG tablet Take 1 tablet (10 mg total) by mouth every 6 (six) hours as needed for nausea or vomiting. 10/18/16   Horton, Mayer Masker, MD  metoCLOPramide (REGLAN) 10 MG  tablet Take 1 tablet (10 mg total) by mouth every 6 (six) hours as needed for nausea (nausea/headache). 10/20/16   Arby Barrette, MD  norelgestromin-ethinyl estradiol (ORTHO EVRA) 150-35 MCG/24HR transdermal patch Place 1 patch onto the skin once a week.    [provider]  ondansetron (ZOFRAN ODT) 8 MG disintegrating tablet Take 1 tablet (8 mg total) by mouth every 8 (eight) hours as needed for nausea or vomiting. 10/18/16   Margarita Grizzle, MD  potassium chloride (K-DUR) 10 MEQ tablet Take 4 tablets (40 mEq total) by mouth daily for 7 days. 02/21/18 02/28/18  Liberty Handy, PA-C  potassium chloride SA (K-DUR,KLOR-CON) 20 MEQ tablet Take 2 tablets (40 mEq total) by mouth daily. 10/18/16   Horton, Mayer Masker, MD  promethazine (PHENERGAN) 25 MG tablet Take 1 tablet (25 mg total) by mouth every 6 (six) hours as needed for nausea or vomiting. Patient not taking: Reported on 10/20/2016 06/28/16   Molpus, Jonny Ruiz, MD    Family History Family History  Problem Relation Age of Onset  . Allergic rhinitis Father   . Asthma Father   . Urticaria Father   . Asthma Paternal Aunt   . Eczema Paternal Uncle   . Asthma Paternal Grandfather     Social History Social History   Tobacco Use  . Smoking status: Former Smoker    Types: Cigarettes  .  Smokeless tobacco: Never Used  Substance Use Topics  . Alcohol use: No  . Drug use: Yes    Types: Marijuana     Allergies   Patient has no known allergies.   Review of Systems Review of Systems  Constitutional: Positive for chills.  Gastrointestinal: Positive for abdominal pain, nausea and vomiting.       Blood in vomit  All other systems reviewed and are negative.    Physical Exam Updated Vital Signs BP 119/72   Pulse (!) 108   Temp 98.2 F (36.8 C) (Oral)   Resp 20   Ht 5\' 4"  (1.626 m)   Wt 81.6 kg   LMP 02/21/2018   SpO2 100%   BMI 30.90 kg/m   Physical Exam  Constitutional: She is oriented to person, place, and time. She  appears well-developed and well-nourished.  Walking back from bathroom, in NAD  HENT:  Head: Normocephalic and atraumatic.  Nose: Nose normal.  MMM.   Eyes: Pupils are equal, round, and reactive to light. Conjunctivae and EOM are normal.  Neck: Normal range of motion.  Cardiovascular: Normal rate and regular rhythm.  Pulmonary/Chest: Effort normal and breath sounds normal.  Abdominal: Soft. Bowel sounds are normal. There is tenderness.  Mild epigastric tenderness. No distention. No G/R/R. No suprapubic or CVA tenderness. Negative Murphy's and McBurney's. Active BS to lower quadrants.   Musculoskeletal: Normal range of motion.  Neurological: She is alert and oriented to person, place, and time.  Skin: Skin is warm and dry. Capillary refill takes less than 2 seconds.  Psychiatric: She has a normal mood and affect. Her behavior is normal.  Nursing note and vitals reviewed.    ED Treatments / Results  Labs (all labs ordered are listed, but only abnormal results are displayed) Labs Reviewed  COMPREHENSIVE METABOLIC PANEL - Abnormal; Notable for the following components:      Result Value   Potassium 3.1 (*)    CO2 20 (*)    Glucose, Bld 129 (*)    Calcium 8.8 (*)    All other components within normal limits  CBC - Abnormal; Notable for the following components:   WBC 18.3 (*)    All other components within normal limits  URINALYSIS, ROUTINE W REFLEX MICROSCOPIC - Abnormal; Notable for the following components:   APPearance HAZY (*)    Hgb urine dipstick LARGE (*)    Ketones, ur 15 (*)    Protein, ur 30 (*)    Leukocytes, UA TRACE (*)    All other components within normal limits  URINALYSIS, MICROSCOPIC (REFLEX) - Abnormal; Notable for the following components:   Bacteria, UA MANY (*)    All other components within normal limits  RAPID URINE DRUG SCREEN, HOSP PERFORMED - Abnormal; Notable for the following components:   Tetrahydrocannabinol POSITIVE (*)    All other components  within normal limits  LIPASE, BLOOD  PREGNANCY, URINE    EKG EKG Interpretation  Date/Time:  Sunday February 21 2018 12:39:23 EDT Ventricular Rate:  78 PR Interval:    QRS Duration: 101 QT Interval:  409 QTC Calculation: 466 R Axis:   62 Text Interpretation:  Sinus rhythm Prolonged PR interval Borderline T abnormalities, anterior leads No significant change since last tracing Confirmed by Linwood Dibbles [phone]) on 02/21/2018 12:42:34 PM Also confirmed by Linwood Dibbles [phone]), editor Josephine Igo ([id number])  on 02/21/2018 2:43:30 PM   Radiology Ct Abdomen Pelvis W Contrast  Result Date: 02/21/2018 CLINICAL DATA:  Abdominal pain, nausea,  and vomiting for 1 day. Personal history of pancreatitis and colitis. EXAM: CT ABDOMEN AND PELVIS WITH CONTRAST TECHNIQUE: Multidetector CT imaging of the abdomen and pelvis was performed using the standard protocol following bolus administration of intravenous contrast. CONTRAST:  ISOVUE-300 IOPAMIDOL (ISOVUE-300) INJECTION 61% COMPARISON:  02/17/2016 FINDINGS: Lower Chest: No acute findings. Hepatobiliary: No hepatic masses identified. Focal fatty infiltration again seen adjacent to the falciform ligament. Gallbladder is unremarkable. Pancreas:  No mass or inflammatory changes. Spleen: Within normal limits in size and appearance. Adrenals/Urinary Tract: No masses identified. No evidence of hydronephrosis. Stomach/Bowel: No evidence of obstruction, inflammatory process or abnormal fluid collections. Vascular/Lymphatic: No pathologically enlarged lymph nodes. No abdominal aortic aneurysm. Reproductive:  No mass or other significant abnormality. Other:  None. Musculoskeletal:  No suspicious bone lesions identified. IMPRESSION: Stable exam.  No acute findings or other significant abnormality. Electronically Signed   By: Myles Rosenthal M.D.   On: 02/21/2018 16:51    Procedures Procedures (including critical care time)  Medications Ordered in ED Medications    haloperidol lactate (HALDOL) injection 2.5 mg (2.5 mg Intravenous Given 02/21/18 1238)  LORazepam (ATIVAN) injection 1 mg (1 mg Intravenous Given 02/21/18 1238)  sodium chloride 0.9 % bolus 1,000 mL (0 mLs Intravenous Stopped 02/21/18 1658)  famotidine (PEPCID) IVPB 20 mg premix (0 mg Intravenous Stopped 02/21/18 1657)  pantoprazole (PROTONIX) injection 40 mg (40 mg Intravenous Given 02/21/18 1332)  iopamidol (ISOVUE-300) 61 % injection 100 mL (100 mLs Intravenous Contrast Given 02/21/18 1607)     Initial Impression / Assessment and Plan / ED Course  I have reviewed the triage vital signs and the nursing notes.  Pertinent labs & imaging results that were available during my care of the patient were reviewed by me and considered in my medical decision making (see chart for details).  Clinical Course as of Feb 21 1850  Sun Feb 21, 2018  1356 Potassium(!): 3.1 [CG]  1356 WBC(!): 18.3 [CG]  1356 Bacteria, UA(!): MANY [CG]  1357 Hgb urine dipstick(!): LARGE [CG]  1357 Ketones, ur(!): 15 [CG]  1357 Protein(!): 30 [CG]  1357 Leukocytes, UA(!): TRACE [CG]  1534 Re-evaluated pt. Nausea and vomiting significantly improved, she continues to report epigastric tenderness with mild guarding. 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 Dark maroon/brown emesis noted at bedside. She is HD stable w/o CP, SOB. She has had work up by GI with normal gastric emptying study.  She has had EGD but I cannot find results.  Concern for gastritis with hemorrhage vs PUD vs hyperemesis syndrome flare.  Also considered but less likely pancreatitis, cholecystitis, appendicitis. Previous noted borderline qt prolongation. Will provide symptoms control, pending labs.   1720: WBC 18.3. hgb 12.3. K 3.1. Ketonuria. Pt currently menstruating with hgb in urine, trace leuks,  negative nitrites, many bacteria, 11-20 WBC.  Will send urine culture and defer abx today as she is asymptomatic.  Given leukocytosis, h/o pancreatitis, and persistent epigastric tendernss, CTAP obtained which was reassuring. Pt did not have emesis in the 5 hours observed in ER. When I explained to pt we will discharge her with a limited rx for ativan (3 pills) for the next 24 hours, EMT observed pt began to dry heave.  I do not think pt needs further emergent work up or admission.  She is HD. Tolerating water.  Had lengthy discussion with pt regarding avoidance of irritating foods such as ETOH, acidic foods, marijuana. States she has not used marijuana in months but THC positive in urine today.  She is aware she needs to f/u with GI for long term management of recurrent n/v.  I explained Ativan is a controlled substance and we cannot provide long term rx or management of her n/v.  Given her borderline prolonged qtc (although improving), will give her benefit of the doubt today and send her home with 3 tabs of ativan.  K supplements given. Return precautions given.   Final Clinical Impressions(s) / ED Diagnoses   Final diagnoses:  Cyclical vomiting, not intractable  Hypokalemia    ED Discharge Orders         Ordered    LORazepam (ATIVAN) 1 MG tablet  Every 8 hours,   Status:  Discontinued     02/21/18 1711    potassium chloride (K-DUR) 10 MEQ tablet  Daily     02/21/18 1724    LORazepam (ATIVAN) 1 MG tablet  Every 8 hours,   Status:  Discontinued     02/21/18 1744    LORazepam (ATIVAN) 1 MG tablet  Every 8 hours     02/21/18 1803           Liberty Handy, PA-C 02/21/18 1851    Linwood Dibbles, MD 02/22/18 2116

## 2018-02-21 NOTE — ED Notes (Signed)
Pt on cardiac monitor and auto VS 

## 2018-02-21 NOTE — ED Notes (Signed)
Provider switched prescription location to [facility] on [address] per pt request.

## 2018-02-21 NOTE — ED Triage Notes (Signed)
Pt reports N/V that started today. Denies pain. denies diarrhea.

## 2018-02-21 NOTE — ED Notes (Signed)
rx cancelled at [facility] and pt given a paper rx.

## 2018-02-21 NOTE — ED Notes (Signed)
Pt verbalized understanding of dc instructions.

## 2018-02-22 ENCOUNTER — Emergency Department (HOSPITAL_BASED_OUTPATIENT_CLINIC_OR_DEPARTMENT_OTHER)
Admission: EM | Admit: 2018-02-22 | Discharge: 2018-02-22 | Disposition: A | Discharge status: Home / Self Care | Payer: BLUE CROSS/BLUE SHIELD | Attending: Emergency Medicine | Admitting: Emergency Medicine

## 2018-02-22 ENCOUNTER — Other Ambulatory Visit: Payer: Self-pay

## 2018-02-22 ENCOUNTER — Encounter (HOSPITAL_BASED_OUTPATIENT_CLINIC_OR_DEPARTMENT_OTHER): Payer: Self-pay | Admitting: *Deleted

## 2018-02-22 DIAGNOSIS — R112 Nausea with vomiting, unspecified: Secondary | ICD-10-CM | POA: Diagnosis not present

## 2018-02-22 DIAGNOSIS — Z87891 Personal history of nicotine dependence: Secondary | ICD-10-CM | POA: Insufficient documentation

## 2018-02-22 DIAGNOSIS — Z79899 Other long term (current) drug therapy: Secondary | ICD-10-CM | POA: Diagnosis not present

## 2018-02-22 DIAGNOSIS — E876 Hypokalemia: Secondary | ICD-10-CM | POA: Insufficient documentation

## 2018-02-22 DIAGNOSIS — D72829 Elevated white blood cell count, unspecified: Secondary | ICD-10-CM | POA: Diagnosis not present

## 2018-02-22 LAB — CBC WITH DIFFERENTIAL/PLATELET
Abs Immature Granulocytes: 0.05 10*3/uL (ref 0.00–0.07)
BASOS PCT: 0 %
Basophils Absolute: 0 10*3/uL (ref 0.0–0.1)
EOS ABS: 0 10*3/uL (ref 0.0–0.5)
Eosinophils Relative: 0 %
HCT: 36.2 % (ref 36.0–46.0)
Hemoglobin: 12.3 g/dL (ref 12.0–15.0)
Immature Granulocytes: 0 %
Lymphocytes Relative: 6 %
Lymphs Abs: 0.7 10*3/uL (ref 0.7–4.0)
MCH: 31.2 pg (ref 26.0–34.0)
MCHC: 34 g/dL (ref 30.0–36.0)
MCV: 91.9 fL (ref 80.0–100.0)
MONO ABS: 0.4 10*3/uL (ref 0.1–1.0)
MONOS PCT: 4 %
NEUTROS ABS: 10.5 10*3/uL — AB (ref 1.7–7.7)
Neutrophils Relative %: 90 %
PLATELETS: 215 10*3/uL (ref 150–400)
RBC: 3.94 MIL/uL (ref 3.87–5.11)
RDW: 12.4 % (ref 11.5–15.5)
WBC: 11.7 10*3/uL — ABNORMAL HIGH (ref 4.0–10.5)
nRBC: 0 % (ref 0.0–0.2)

## 2018-02-22 LAB — COMPREHENSIVE METABOLIC PANEL
ALBUMIN: 4.4 g/dL (ref 3.5–5.0)
ALK PHOS: 49 U/L (ref 38–126)
ALT: 14 U/L (ref 0–44)
AST: 19 U/L (ref 15–41)
Anion gap: 8 (ref 5–15)
BILIRUBIN TOTAL: 0.4 mg/dL (ref 0.3–1.2)
BUN: 7 mg/dL (ref 6–20)
CALCIUM: 9.3 mg/dL (ref 8.9–10.3)
CO2: 23 mmol/L (ref 22–32)
Chloride: 109 mmol/L (ref 98–111)
Creatinine, Ser: 0.71 mg/dL (ref 0.44–1.00)
GFR calc Af Amer: >60 mL/min (ref >60)
GFR calc non Af Amer: >60 mL/min (ref >60)
GLUCOSE: 111 mg/dL — AB (ref 70–99)
Potassium: 3.1 mmol/L — ABNORMAL LOW (ref 3.5–5.1)
Sodium: 140 mmol/L (ref 135–145)
TOTAL PROTEIN: 7.7 g/dL (ref 6.5–8.1)

## 2018-02-22 LAB — LIPASE, BLOOD: Lipase: 26 U/L (ref 11–51)

## 2018-02-22 MED ORDER — PANTOPRAZOLE SODIUM 20 MG PO TBEC: 20.0000 mg | DELAYED_RELEASE_TABLET | Freq: Every day | ORAL | 0 refills | Status: DC

## 2018-02-22 MED ORDER — METOCLOPRAMIDE HCL 5 MG/ML IJ SOLN
10.0000 mg | Freq: Once | INTRAMUSCULAR | Status: AC
  Administered 2018-02-22: 10 mg via INTRAVENOUS
  Filled 2018-02-22: qty 2

## 2018-02-22 MED ORDER — FAMOTIDINE IN NACL 20-0.9 MG/50ML-% IV SOLN
20.0000 mg | Freq: Once | INTRAVENOUS | Status: AC
  Administered 2018-02-22: 20 mg via INTRAVENOUS
  Filled 2018-02-22: qty 50

## 2018-02-22 MED ORDER — SODIUM CHLORIDE 0.9 % IV BOLUS
1000.0000 mL | Freq: Once | INTRAVENOUS | Status: AC
  Administered 2018-02-22: 1000 mL via INTRAVENOUS

## 2018-02-22 MED ORDER — POTASSIUM CHLORIDE CRYS ER 20 MEQ PO TBCR
40.0000 meq | EXTENDED_RELEASE_TABLET | Freq: Once | ORAL | Status: AC
  Administered 2018-02-22: 40 meq via ORAL
  Filled 2018-02-22: qty 2

## 2018-02-22 MED ORDER — DIPHENHYDRAMINE HCL 50 MG/ML IJ SOLN
25.0000 mg | Freq: Once | INTRAMUSCULAR | Status: AC
  Administered 2018-02-22: 25 mg via INTRAVENOUS
  Filled 2018-02-22: qty 1

## 2018-02-22 MED ORDER — SUCRALFATE 1 G PO TABS: 1.0000 g | ORAL_TABLET | Freq: Three times a day (TID) | ORAL | 0 refills | Status: DC

## 2018-02-22 MED ORDER — METOCLOPRAMIDE HCL 10 MG PO TABS: 10.0000 mg | ORAL_TABLET | Freq: Four times a day (QID) | ORAL | 0 refills | Status: DC

## 2018-02-22 MED ORDER — SODIUM CHLORIDE 0.9 % IV SOLN
INTRAVENOUS | Status: DC | PRN
  Administered 2018-02-22: 250 mL via INTRAVENOUS

## 2018-02-22 NOTE — Discharge Instructions (Signed)
Please see the information and instructions below regarding your visit.  Your diagnoses today include:  1. Non-intractable vomiting with nausea, unspecified vomiting type   2. Hypokalemia   3. Leukocytosis, unspecified type    Tests performed today include: See side panel of your discharge paperwork for testing performed today. Vital signs are listed at the bottom of these instructions.   Your potassium is still low today.  Please continue your potassium supplementation.  Medications prescribed:    Take any prescribed medications only as prescribed, and any over the counter medications only as directed on the packaging.  Please start medication called Protonix.  It is a proton pump inhibitor.  Please take it daily with breakfast.  Please start taking a medicine called Carafate or sucralfate.  This can be taken up to 4 times a day, with meals and before bedtime.  Please do not take your proton pump inhibitor (Protonix, Nexium, Prilosec) within 30 minutes of taking Carafate.  You may take Reglan every 6 hours as needed for nausea and vomiting.  He may take a 25 mg of Benadryl, but do not drive or drink alcohol or go to work if you have taken Benadryl.  Home care instructions:  Please follow any educational materials contained in this packet.   Please start with a clear liquid diet and mild foods.  Do not eat any dairy until your vomiting and nausea have resolved.  Promote electrolytes such as Gatorade or chicken broth, fluids, bananas, toast.   On testing today, your potassium level was 3.1 which is slightly below normal. I suspect this is due to vomiting. Increasing potassium in your diet should be sufficient to make sure you are getting enough potassium. Below is a list of good sources and servings high in potassium (in order from greatest to least):  -1 cup cooked acorn squash -1 baked potato with skin -1 cup cooked spinach -1 cup cooked lentils -1 cup cooked kidney  beans -Watermelon - cup raisins -1 cup plain yogurt -1 cup orange juice, frozen -Banana -1 cup 1% low-fat milk -1 cup cooked broccoli   Follow-up instructions: Please follow-up with your primary care provider for further evaluation of your symptoms if they are not completely improved.   Please follow-up with Dr. Elnoria Howard.  Return instructions:  Please return to the Emergency Department if you experience worsening symptoms.  Please return to the emergency department if you develop any progressive abdominal pain, worsening nausea or vomiting, or nausea vomiting the prevention from keeping anything down. Please return if you have any other emergent concerns.  Additional Information:   Your vital signs today were: BP 110/72 (BP Location: Right Arm)    Pulse 83    Temp 98.1 F (36.7 C) (Oral)    Resp 14    Ht 5\' 4"  (1.626 m)    Wt 81.6 kg    LMP 02/21/2018    SpO2 100%    BMI 30.88 kg/m  If your blood pressure (BP) was elevated on multiple readings during this visit above 130 for the top number or above 80 for the bottom number, please have this repeated by your primary care provider within one month. --------------  Thank you for allowing [place] to participate in your care today.

## 2018-02-22 NOTE — ED Notes (Signed)
ED Provider at bedside. 

## 2018-02-22 NOTE — ED Notes (Signed)
Patient is resting comfortably. 

## 2018-02-22 NOTE — ED Provider Notes (Signed)
[facility] EMERGENCY DEPARTMENT Provider Note   CSN: [id number] Arrival date & time: 02/22/18  1141     History   Chief Complaint Chief Complaint  Patient presents with  . Emesis    HPI Debra Thornton is a 22 y.o. female.  HPI  Patient is a 22 year old female with a history of colitis, pancreatitis, cyclical vomiting, and esophageal yeast infection, not immunosuppressed presenting for persistent emesis.  Patient reports she was evaluated yesterday for similar, however when she got this morning she began having greater than 10 episodes of vomiting.  Patient reports that it is similar to her prior episodes of cyclical vomiting, for which she previously followed with Dr. Elnoria Howard of gastroenterology.  Patient reports she has seen some streaks of blood within the vomit, but no gross hemetemesis.  Patient denies abdominal pain today, fever, chills, dysuria, urgency, frequency, diarrhea, or constipation.  Patient denies marijuana use x2 months.  Patient denies alcohol use, or other illicit drug use.  Patient reports that she took the Ativan at home and felt it did not help, and she only has 1 mg left.  No abdominal surgical history.  Past Medical History:  Diagnosis Date  . Colitis   . Esophageal yeast infection (HCC)   . Pancreatitis     Patient Active Problem List   Diagnosis Date Noted  . Hypokalemia 03/10/2016  . Acute kidney injury (HCC) 03/10/2016  . Nausea and vomiting 03/08/2016    Past Surgical History:  Procedure Laterality Date  . NO PAST SURGERIES       OB History   None      Home Medications    Prior to Admission medications   Medication Sig Start Date End Date Taking? Authorizing Provider  EPINEPHrine (AUVI-Q) 0.3 mg/0.3 mL IJ SOAJ injection Inject 0.3 mLs (0.3 mg total) into the muscle once. 09/25/16 09/25/16  Alfonse Spruce, MD  LORazepam (ATIVAN) 1 MG tablet Take 1 tablet (1 mg total) by mouth every 8 (eight) hours for 1 day. 02/21/18  02/22/18  Liberty Handy, PA-C  metoCLOPramide (REGLAN) 10 MG tablet Take 1 tablet (10 mg total) by mouth every 6 (six) hours for 3 days. 02/22/18 02/25/18  Aviva Kluver B, PA-C  norelgestromin-ethinyl estradiol (ORTHO EVRA) 150-35 MCG/24HR transdermal patch Place 1 patch onto the skin once a week.    [provider]  ondansetron (ZOFRAN ODT) 8 MG disintegrating tablet Take 1 tablet (8 mg total) by mouth every 8 (eight) hours as needed for nausea or vomiting. 10/18/16   Margarita Grizzle, MD  pantoprazole (PROTONIX) 20 MG tablet Take 1 tablet (20 mg total) by mouth daily. 02/22/18 03/24/18  Aviva Kluver B, PA-C  potassium chloride (K-DUR) 10 MEQ tablet Take 4 tablets (40 mEq total) by mouth daily for 7 days. 02/21/18 02/28/18  Liberty Handy, PA-C  potassium chloride SA (K-DUR,KLOR-CON) 20 MEQ tablet Take 2 tablets (40 mEq total) by mouth daily. 10/18/16   Horton, Mayer Masker, MD  promethazine (PHENERGAN) 25 MG tablet Take 1 tablet (25 mg total) by mouth every 6 (six) hours as needed for nausea or vomiting. Patient not taking: Reported on 10/20/2016 06/28/16   Molpus, Jonny Ruiz, MD  sucralfate (CARAFATE) 1 g tablet Take 1 tablet (1 g total) by mouth 4 (four) times daily -  with meals and at bedtime for 7 days. 02/22/18 03/01/18  Elisha Ponder, PA-C    Family History Family History  Problem Relation Age of Onset  . Allergic rhinitis Father   .  Asthma Father   . Urticaria Father   . Asthma Paternal Aunt   . Eczema Paternal Uncle   . Asthma Paternal Grandfather     Social History Social History   Tobacco Use  . Smoking status: Former Smoker    Types: Cigarettes  . Smokeless tobacco: Never Used  Substance Use Topics  . Alcohol use: No  . Drug use: Yes    Types: Marijuana     Allergies   Patient has no known allergies.   Review of Systems Review of Systems  Constitutional: Negative for chills and fever.  HENT: Negative for congestion.   Eyes: Negative for visual  disturbance.  Respiratory: Negative for cough, chest tightness and shortness of breath.   Cardiovascular: Negative for palpitations and leg swelling.  Gastrointestinal: Positive for nausea and vomiting. Negative for abdominal pain, constipation and diarrhea.  Genitourinary: Negative for dysuria and flank pain.  Musculoskeletal: Negative for back pain and myalgias.  Skin: Negative for rash.  Neurological: Negative for dizziness and syncope.     Physical Exam Updated Vital Signs BP 110/72 (BP Location: Right Arm)   Pulse 83   Temp 98.1 F (36.7 C) (Oral)   Resp 14   Ht 5\' 4"  (1.626 m)   Wt 81.6 kg   LMP 02/21/2018   SpO2 100%   BMI 30.88 kg/m   Physical Exam  Constitutional: She appears well-developed and well-nourished. No distress.  HENT:  Head: Normocephalic and atraumatic.  Mouth/Throat: Oropharynx is clear and moist.  Eyes: Pupils are equal, round, and reactive to light. Conjunctivae and EOM are normal.  Neck: Normal range of motion. Neck supple.  Cardiovascular: Normal rate, regular rhythm, S1 normal and S2 normal.  No murmur heard. Pulmonary/Chest: Effort normal and breath sounds normal. She has no wheezes. She has no rales.  Abdominal: Soft. She exhibits no distension. There is no guarding.  Generalized discomfort to the abdomen without focal nature.  No guarding.  Patient reports tenderness resolved once palpation ceased.   Musculoskeletal: Normal range of motion. She exhibits no edema or deformity.  Lymphadenopathy:    She has no cervical adenopathy.  Neurological: She is alert.  Cranial nerves grossly intact. Patient moves extremities symmetrically and with good coordination.  Skin: Skin is warm and dry. No rash noted. No erythema.  Psychiatric: She has a normal mood and affect. Her behavior is normal. Judgment and thought content normal.  Nursing note and vitals reviewed.    ED Treatments / Results  Labs (all labs ordered are listed, but only abnormal  results are displayed) Labs Reviewed  COMPREHENSIVE METABOLIC PANEL - Abnormal; Notable for the following components:      Result Value   Potassium 3.1 (*)    Glucose, Bld 111 (*)    All other components within normal limits  CBC WITH DIFFERENTIAL/PLATELET - Abnormal; Notable for the following components:   WBC 11.7 (*)    Neutro Abs 10.5 (*)    All other components within normal limits  LIPASE, BLOOD    EKG None  Radiology Ct Abdomen Pelvis W Contrast  Result Date: 02/21/2018 CLINICAL DATA:  Abdominal pain, nausea, and vomiting for 1 day. Personal history of pancreatitis and colitis. EXAM: CT ABDOMEN AND PELVIS WITH CONTRAST TECHNIQUE: Multidetector CT imaging of the abdomen and pelvis was performed using the standard protocol following bolus administration of intravenous contrast. CONTRAST:  ISOVUE-300 IOPAMIDOL (ISOVUE-300) INJECTION 61% COMPARISON:  02/17/2016 FINDINGS: Lower Chest: No acute findings. Hepatobiliary: No hepatic masses identified. Focal  fatty infiltration again seen adjacent to the falciform ligament. Gallbladder is unremarkable. Pancreas:  No mass or inflammatory changes. Spleen: Within normal limits in size and appearance. Adrenals/Urinary Tract: No masses identified. No evidence of hydronephrosis. Stomach/Bowel: No evidence of obstruction, inflammatory process or abnormal fluid collections. Vascular/Lymphatic: No pathologically enlarged lymph nodes. No abdominal aortic aneurysm. Reproductive:  No mass or other significant abnormality. Other:  None. Musculoskeletal:  No suspicious bone lesions identified. IMPRESSION: Stable exam.  No acute findings or other significant abnormality. Electronically Signed   By: Myles Rosenthal M.D.   On: 02/21/2018 16:51    Procedures Procedures (including critical care time)  Medications Ordered in ED Medications  0.9 %  sodium chloride infusion ( Intravenous Stopped 02/22/18 1648)  sodium chloride 0.9 % bolus 1,000 mL (0 mLs  Intravenous Stopped 02/22/18 1703)  metoCLOPramide (REGLAN) injection 10 mg (10 mg Intravenous Given 02/22/18 1600)  diphenhydrAMINE (BENADRYL) injection 25 mg (25 mg Intravenous Given 02/22/18 1557)  famotidine (PEPCID) IVPB 20 mg premix ( Intravenous Stopped 02/22/18 1633)  potassium chloride SA (K-DUR,KLOR-CON) CR tablet 40 mEq (40 mEq Oral Given 02/22/18 1647)     Initial Impression / Assessment and Plan / ED Course  I have reviewed the triage vital signs and the nursing notes.  Pertinent labs & imaging results that were available during my care of the patient were reviewed by me and considered in my medical decision making (see chart for details).  Clinical Course as of Feb 22 1806  Mon Feb 22, 2018  1652 Improving from yesterday.  CBC with Differential(!) [AM]  1652 WBC(!): 11.7 [AM]    Clinical Course User Index [AM] Elisha Ponder, PA-C    Patient is nontoxic-appearing, afebrile, with benign abdomen.  Patient with brief presentation of emesis today.  On my evaluation the patient, she was resting comfortably, and no active emesis was observed in the emergency department.    Lab work is reassuring.  Patient's leukocytosis is significantly improving from yesterday.  Patient has hypokalemia consistent with yesterday, repleted in the emergency department.  Patient has outpatient prescription.  Pregnancy test yesterday was negative, and patient is currently menstruating, therefore will not repeat.  Lipase is negative today.  CT scan from yesterday reviewed, and shows no acute abnormalities.  Patient is having no abdominal tenderness today, therefore feel that repeat imaging not warranted.  Patient was given fluid repletion, potassium repletion, and had good response to Reglan, Benadryl, and Pepcid.  EKG reviewed from yesterday showed that QT was normal, and proceeded with Reglan prescription.  Patient had outpatient potassium prescription from yesterday.  Given patient's report of some  streaks of blood in the vomit, we will treat for possible Mallory-Weiss tear with Protonix and Carafate.  Patient instructed to follow-up with her gastroenterologist.  Return precautions were given for any abdominal pain, or intractable nausea or vomiting.  Patient is in understanding and agrees with the plan of care.  Final Clinical Impressions(s) / ED Diagnoses   Final diagnoses:  Non-intractable vomiting with nausea, unspecified vomiting type  Hypokalemia  Leukocytosis, unspecified type    ED Discharge Orders         Ordered    metoCLOPramide (REGLAN) 10 MG tablet  Every 6 hours     02/22/18 1740    pantoprazole (PROTONIX) 20 MG tablet  Daily     02/22/18 1740    sucralfate (CARAFATE) 1 g tablet  3 times daily with meals & bedtime     02/22/18 1740  Elisha Ponder, PA-C 02/22/18 1815    Little, Ambrose Finland, MD 02/23/18 [phone]

## 2018-02-22 NOTE — ED Triage Notes (Signed)
Nausea and vomiting. States she was seen for same yesterday.

## 2018-02-23 ENCOUNTER — Encounter (HOSPITAL_BASED_OUTPATIENT_CLINIC_OR_DEPARTMENT_OTHER): Payer: Self-pay | Admitting: Emergency Medicine

## 2018-02-23 ENCOUNTER — Emergency Department (HOSPITAL_BASED_OUTPATIENT_CLINIC_OR_DEPARTMENT_OTHER)
Admission: EM | Admit: 2018-02-23 | Discharge: 2018-02-23 | Disposition: A | Discharge status: Home / Self Care | Payer: BLUE CROSS/BLUE SHIELD | Attending: Emergency Medicine | Admitting: Emergency Medicine

## 2018-02-23 ENCOUNTER — Other Ambulatory Visit: Payer: Self-pay

## 2018-02-23 DIAGNOSIS — Z87891 Personal history of nicotine dependence: Secondary | ICD-10-CM | POA: Insufficient documentation

## 2018-02-23 DIAGNOSIS — Z79899 Other long term (current) drug therapy: Secondary | ICD-10-CM | POA: Insufficient documentation

## 2018-02-23 DIAGNOSIS — G43A0 Cyclical vomiting, not intractable: Secondary | ICD-10-CM | POA: Diagnosis not present

## 2018-02-23 DIAGNOSIS — R1115 Cyclical vomiting syndrome unrelated to migraine: Secondary | ICD-10-CM

## 2018-02-23 DIAGNOSIS — R1110 Vomiting, unspecified: Secondary | ICD-10-CM | POA: Diagnosis present

## 2018-02-23 LAB — CBC WITH DIFFERENTIAL/PLATELET
Abs Immature Granulocytes: 0.05 10*3/uL (ref 0.00–0.07)
Basophils Absolute: 0 10*3/uL (ref 0.0–0.1)
Basophils Relative: 0 %
Eosinophils Absolute: 0 10*3/uL (ref 0.0–0.5)
Eosinophils Relative: 0 %
HCT: 35.7 % — ABNORMAL LOW (ref 36.0–46.0)
Hemoglobin: 12 g/dL (ref 12.0–15.0)
Immature Granulocytes: 0 %
Lymphocytes Relative: 9 %
Lymphs Abs: 1.2 10*3/uL (ref 0.7–4.0)
MCH: 30.8 pg (ref 26.0–34.0)
MCHC: 33.6 g/dL (ref 30.0–36.0)
MCV: 91.8 fL (ref 80.0–100.0)
Monocytes Absolute: 0.9 10*3/uL (ref 0.1–1.0)
Monocytes Relative: 7 %
Neutro Abs: 10.5 10*3/uL — ABNORMAL HIGH (ref 1.7–7.7)
Neutrophils Relative %: 84 %
Platelets: 223 10*3/uL (ref 150–400)
RBC: 3.89 MIL/uL (ref 3.87–5.11)
RDW: 12.7 % (ref 11.5–15.5)
WBC: 12.7 10*3/uL — ABNORMAL HIGH (ref 4.0–10.5)
nRBC: 0 % (ref 0.0–0.2)

## 2018-02-23 LAB — COMPREHENSIVE METABOLIC PANEL
ALT: 16 U/L (ref 0–44)
AST: 28 U/L (ref 15–41)
Albumin: 4 g/dL (ref 3.5–5.0)
Alkaline Phosphatase: 46 U/L (ref 38–126)
Anion gap: 7 (ref 5–15)
BUN: 7 mg/dL (ref 6–20)
CO2: 22 mmol/L (ref 22–32)
Calcium: 8.9 mg/dL (ref 8.9–10.3)
Chloride: 110 mmol/L (ref 98–111)
Creatinine, Ser: 0.67 mg/dL (ref 0.44–1.00)
GFR calc Af Amer: >60 mL/min (ref >60)
GFR calc non Af Amer: >60 mL/min (ref >60)
Glucose, Bld: 113 mg/dL — ABNORMAL HIGH (ref 70–99)
Potassium: 2.9 mmol/L — ABNORMAL LOW (ref 3.5–5.1)
Sodium: 139 mmol/L (ref 135–145)
Total Bilirubin: 0.5 mg/dL (ref 0.3–1.2)
Total Protein: 7.2 g/dL (ref 6.5–8.1)

## 2018-02-23 LAB — LIPASE, BLOOD: Lipase: 34 U/L (ref 11–51)

## 2018-02-23 MED ORDER — POTASSIUM CHLORIDE CRYS ER 20 MEQ PO TBCR
20.0000 meq | EXTENDED_RELEASE_TABLET | Freq: Once | ORAL | Status: AC
  Administered 2018-02-23: 20 meq via ORAL
  Filled 2018-02-23: qty 1

## 2018-02-23 MED ORDER — SODIUM CHLORIDE 0.9 % IV SOLN
INTRAVENOUS | Status: DC | PRN
  Administered 2018-02-23: 250 mL via INTRAVENOUS

## 2018-02-23 MED ORDER — SODIUM CHLORIDE 0.9 % IV BOLUS
1000.0000 mL | Freq: Once | INTRAVENOUS | Status: AC
  Administered 2018-02-23: 1000 mL via INTRAVENOUS

## 2018-02-23 MED ORDER — POTASSIUM CHLORIDE CRYS ER 10 MEQ PO TBCR: 10.0000 meq | EXTENDED_RELEASE_TABLET | Freq: Every day | ORAL | 0 refills | Status: DC

## 2018-02-23 MED ORDER — PROCHLORPERAZINE EDISYLATE 10 MG/2ML IJ SOLN: 10.0000 mg | Freq: Once | INTRAMUSCULAR | Status: DC

## 2018-02-23 MED ORDER — PROMETHAZINE HCL 25 MG PO TABS
12.5000 mg | ORAL_TABLET | Freq: Once | ORAL | Status: AC
  Administered 2018-02-23: 12.5 mg via ORAL
  Filled 2018-02-23: qty 1

## 2018-02-23 MED ORDER — PROMETHAZINE HCL 25 MG/ML IJ SOLN
12.5000 mg | Freq: Once | INTRAMUSCULAR | Status: AC
  Administered 2018-02-23: 12.5 mg via INTRAVENOUS
  Filled 2018-02-23: qty 1

## 2018-02-23 MED ORDER — PROMETHAZINE HCL 25 MG PO TABS: 25.0000 mg | ORAL_TABLET | Freq: Four times a day (QID) | ORAL | 0 refills | Status: DC | PRN

## 2018-02-23 MED ORDER — FAMOTIDINE IN NACL 20-0.9 MG/50ML-% IV SOLN
20.0000 mg | Freq: Once | INTRAVENOUS | Status: AC
  Administered 2018-02-23: 20 mg via INTRAVENOUS
  Filled 2018-02-23: qty 50

## 2018-02-23 MED ORDER — HALOPERIDOL LACTATE 5 MG/ML IJ SOLN
2.0000 mg | Freq: Once | INTRAMUSCULAR | Status: AC
  Administered 2018-02-23: 2 mg via INTRAVENOUS
  Filled 2018-02-23: qty 1

## 2018-02-23 MED ORDER — POTASSIUM CHLORIDE CRYS ER 20 MEQ PO TBCR
40.0000 meq | EXTENDED_RELEASE_TABLET | Freq: Once | ORAL | Status: AC
  Administered 2018-02-23: 40 meq via ORAL
  Filled 2018-02-23: qty 2

## 2018-02-23 MED ORDER — CAPSAICIN 0.025 % EX CREA
TOPICAL_CREAM | Freq: Once | CUTANEOUS | Status: DC
  Filled 2018-02-23 (×2): qty 60

## 2018-02-23 MED ORDER — DIPHENHYDRAMINE HCL 50 MG/ML IJ SOLN
25.0000 mg | Freq: Once | INTRAMUSCULAR | Status: AC
  Administered 2018-02-23: 25 mg via INTRAVENOUS
  Filled 2018-02-23: qty 1

## 2018-02-23 MED ORDER — POTASSIUM CHLORIDE 10 MEQ/100ML IV SOLN
10.0000 meq | Freq: Once | INTRAVENOUS | Status: AC
  Administered 2018-02-23: 10 meq via INTRAVENOUS
  Filled 2018-02-23: qty 100

## 2018-02-23 NOTE — Discharge Instructions (Signed)
Take Phenergan every 6 hours as needed for nausea or vomiting.  Do not combine this medication with Reglan.  Your gastroenterologist may change your regimen tomorrow, however you can take Phenergan until then.  Take potassium for the next 5 days to help replenish your potassium levels.  It is important that you see your gastroenterologist at your scheduled appointment tomorrow as they can give you more definitive treatment for your cyclical vomiting syndrome.  Please return the emergency department if you develop any new or worsening symptoms.

## 2018-02-23 NOTE — ED Notes (Signed)
Pt reports mild nausea but has been able to keep PO potassium down at this time. Pt resting on stretcher in NAD. Family at bedside.

## 2018-02-23 NOTE — ED Triage Notes (Signed)
Pt presents for continued vomiting that is not responding to the Reglan she was prescribed yesterday.  Took Reglan at 8am.  Is vomiting currently.

## 2018-02-23 NOTE — ED Notes (Signed)
ED Provider at bedside. 

## 2018-02-23 NOTE — ED Provider Notes (Signed)
[facility] EMERGENCY DEPARTMENT Provider Note   CSN: [id number] Arrival date & time: 02/23/18  4098     History   Chief Complaint Chief Complaint  Patient presents with  . Emesis    HPI Debra Thornton is a 22 y.o. female with history of colitis, pancreatitis, esophageal yeast infection, cyclical vomiting syndrome who presents with 2-day history of emesis.  Patient was evaluated yesterday and felt better upon leaving, however woke up this morning and began vomiting again.  She has vomited over 10 times since her visit yesterday.  She has been taking Reglan without relief.  She reports Reglan usually helps for her.  She has a gastroenterologist appointment scheduled for tomorrow morning.  She has not seen her gastroneurologist in a while, but is followed by 1.  She denies any fevers.  She has had abdominal pain only with vomiting.  She denies any chest pain, shortness of breath, diarrhea, bloody stools, urinary symptoms or hematemesis.  She denies any drug or alcohol use, including marijuana.  HPI  Past Medical History:  Diagnosis Date  . Colitis   . Esophageal yeast infection (HCC)   . Pancreatitis     Patient Active Problem List   Diagnosis Date Noted  . Hypokalemia 03/10/2016  . Acute kidney injury (HCC) 03/10/2016  . Nausea and vomiting 03/08/2016    Past Surgical History:  Procedure Laterality Date  . NO PAST SURGERIES       OB History   None      Home Medications    Prior to Admission medications   Medication Sig Start Date End Date Taking? Authorizing Provider  metoCLOPramide (REGLAN) 10 MG tablet Take 1 tablet (10 mg total) by mouth every 6 (six) hours for 3 days. 02/22/18 02/25/18  Aviva Kluver B, PA-C  pantoprazole (PROTONIX) 20 MG tablet Take 1 tablet (20 mg total) by mouth daily. 02/22/18 03/24/18  Aviva Kluver B, PA-C  potassium chloride (K-DUR,KLOR-CON) 10 MEQ tablet Take 1 tablet (10 mEq total) by mouth daily. 02/23/18   Jhovani Griswold, Waylan Boga, PA-C  promethazine (PHENERGAN) 25 MG tablet Take 1 tablet (25 mg total) by mouth every 6 (six) hours as needed for nausea or vomiting. 02/23/18   Carmita Boom, Waylan Boga, PA-C  sucralfate (CARAFATE) 1 g tablet Take 1 tablet (1 g total) by mouth 4 (four) times daily -  with meals and at bedtime for 7 days. 02/22/18 03/01/18  Elisha Ponder, PA-C    Family History Family History  Problem Relation Age of Onset  . Allergic rhinitis Father   . Asthma Father   . Urticaria Father   . Asthma Paternal Aunt   . Eczema Paternal Uncle   . Asthma Paternal Grandfather     Social History Social History   Tobacco Use  . Smoking status: Former Smoker    Types: Cigarettes  . Smokeless tobacco: Never Used  Substance Use Topics  . Alcohol use: No  . Drug use: Yes    Types: Marijuana     Allergies   Patient has no known allergies.   Review of Systems Review of Systems  Constitutional: Negative for chills and fever.  HENT: Negative for facial swelling and sore throat.   Respiratory: Negative for shortness of breath.   Cardiovascular: Negative for chest pain.  Gastrointestinal: Positive for nausea and vomiting. Negative for abdominal pain, constipation and diarrhea.  Genitourinary: Negative for dysuria.  Musculoskeletal: Negative for back pain.  Skin: Negative for rash and wound.  Neurological:  Negative for headaches.  Psychiatric/Behavioral: The patient is not nervous/anxious.      Physical Exam Updated Vital Signs BP 114/76   Pulse 79   Resp (!) 27   Ht 5\' 4"  (1.626 m)   Wt 81.6 kg   LMP 02/21/2018   SpO2 100%   BMI 30.88 kg/m   Physical Exam  Constitutional: She appears well-developed and well-nourished. No distress.  HENT:  Head: Normocephalic and atraumatic.  Mouth/Throat: Mucous membranes are dry. No oropharyngeal exudate.  Eyes: Pupils are equal, round, and reactive to light. Conjunctivae are normal. Right eye exhibits no discharge. Left eye exhibits no discharge. No  scleral icterus.  Neck: Normal range of motion. Neck supple. No thyromegaly present.  Cardiovascular: Normal rate, regular rhythm, normal heart sounds and intact distal pulses. Exam reveals no gallop and no friction rub.  No murmur heard. Pulmonary/Chest: Effort normal and breath sounds normal. No stridor. No respiratory distress. She has no wheezes. She has no rales.  Abdominal: Soft. Bowel sounds are normal. She exhibits no distension. There is tenderness (generalized, mild). There is no rebound and no guarding.  Musculoskeletal: She exhibits no edema.  Lymphadenopathy:    She has no cervical adenopathy.  Neurological: She is alert. Coordination normal.  Skin: Skin is warm and dry. No rash noted. She is not diaphoretic. No pallor.  Psychiatric: She has a normal mood and affect.  Nursing note and vitals reviewed.    ED Treatments / Results  Labs (all labs ordered are listed, but only abnormal results are displayed) Labs Reviewed  COMPREHENSIVE METABOLIC PANEL - Abnormal; Notable for the following components:      Result Value   Potassium 2.9 (*)    Glucose, Bld 113 (*)    All other components within normal limits  CBC WITH DIFFERENTIAL/PLATELET - Abnormal; Notable for the following components:   WBC 12.7 (*)    HCT 35.7 (*)    Neutro Abs 10.5 (*)    All other components within normal limits  LIPASE, BLOOD    EKG EKG Interpretation  Date/Time:  Tuesday February 23 2018 11:01:06 EDT Ventricular Rate:  72 PR Interval:    QRS Duration: 95 QT Interval:  396 QTC Calculation: 434 R Axis:   55 Text Interpretation:  Sinus arrhythmia RSR' in V1 or V2, right VCD or RVH Borderline T abnormalities, anterior leads Baseline wander in lead(s) III When compared to prior, no significant changes seen.  No STEMI Confirmed by Theda Belfast ([id number]) on 02/23/2018 11:21:45 AM   Radiology No results found.  Procedures Procedures (including critical care time)  Medications Ordered in  ED Medications  sodium chloride 0.9 % bolus 1,000 mL ( Intravenous Stopped 02/23/18 1145)  famotidine (PEPCID) IVPB 20 mg premix ( Intravenous Stopped 02/23/18 1127)  haloperidol lactate (HALDOL) injection 2 mg (2 mg Intravenous Given 02/23/18 1110)  potassium chloride 10 mEq in 100 mL IVPB ( Intravenous Stopped 02/23/18 1331)  potassium chloride SA (K-DUR,KLOR-CON) CR tablet 40 mEq (40 mEq Oral Given 02/23/18 1255)  diphenhydrAMINE (BENADRYL) injection 25 mg (25 mg Intravenous Given 02/23/18 1210)  promethazine (PHENERGAN) injection 12.5 mg (12.5 mg Intravenous Given 02/23/18 1211)  potassium chloride SA (K-DUR,KLOR-CON) CR tablet 20 mEq (20 mEq Oral Given 02/23/18 1331)  promethazine (PHENERGAN) tablet 12.5 mg (12.5 mg Oral Given 02/23/18 1427)     Initial Impression / Assessment and Plan / ED Course  I have reviewed the triage vital signs and the nursing notes.  Pertinent labs & imaging results that  were available during my care of the patient were reviewed by me and considered in my medical decision making (see chart for details).  Clinical Course as of Feb 24 1808  Tue Feb 23, 2018  1318 Patient sleeping on reassessment after Phenergan, Benadryl. Haldol gave no relief. Patient declined capsaicin and stated it did not help her.   [AL]    Clinical Course User Index [AL] Emi Holes, PA-C    Patient with history of cyclical vomiting syndrome presents with emesis typical of her normal flareups. UDS positive for THC 2 days ago.  Mild leukocytosis at 12.7.  She initially had generalized abdominal tenderness, although soft.  This was resolved with control of nausea and emesis.  No indication for CT today.  Patient given Haldol and Pepcid without relief.  Patient did have improvement of symptoms with Phenergan.  IV fluids given.  Potassium replaced orally and through the IV.  Will discharge home with Phenergan.  Patient has a GI doctor appointment scheduled for tomorrow.  I stressed the  importance of her going to this appointment.  Strict return precautions given.  Patient understands and agrees with plan.  Patient vitals stable throughout ED course and discharged in satisfactory condition.  Final Clinical Impressions(s) / ED Diagnoses   Final diagnoses:  Cyclical vomiting, not intractable    ED Discharge Orders         Ordered    promethazine (PHENERGAN) 25 MG tablet  Every 6 hours PRN,   Status:  Discontinued     02/23/18 1426    potassium chloride SA (K-DUR,KLOR-CON) 10 MEQ tablet  Daily,   Status:  Discontinued     02/23/18 1426    potassium chloride (K-DUR,KLOR-CON) 10 MEQ tablet  Daily     02/23/18 1433    promethazine (PHENERGAN) 25 MG tablet  Every 6 hours PRN     02/23/18 1433           LawWaylan Boga, PA-C 02/23/18 1811    Tegeler, Canary Brim, MD 02/24/18 [phone]

## 2018-02-24 DIAGNOSIS — R112 Nausea with vomiting, unspecified: Secondary | ICD-10-CM | POA: Diagnosis not present

## 2018-04-06 DIAGNOSIS — Z87891 Personal history of nicotine dependence: Secondary | ICD-10-CM | POA: Diagnosis not present

## 2018-04-06 DIAGNOSIS — R000 Tachycardia, unspecified: Secondary | ICD-10-CM | POA: Diagnosis not present

## 2018-04-06 DIAGNOSIS — E872 Acidosis: Secondary | ICD-10-CM | POA: Diagnosis not present

## 2018-04-06 DIAGNOSIS — F1299 Cannabis use, unspecified with unspecified cannabis-induced disorder: Secondary | ICD-10-CM | POA: Diagnosis not present

## 2018-04-06 DIAGNOSIS — D72829 Elevated white blood cell count, unspecified: Secondary | ICD-10-CM | POA: Diagnosis not present

## 2018-04-06 DIAGNOSIS — R112 Nausea with vomiting, unspecified: Secondary | ICD-10-CM | POA: Diagnosis not present

## 2018-04-06 DIAGNOSIS — E876 Hypokalemia: Secondary | ICD-10-CM | POA: Diagnosis not present

## 2018-04-06 DIAGNOSIS — Z79899 Other long term (current) drug therapy: Secondary | ICD-10-CM | POA: Diagnosis not present

## 2018-04-07 DIAGNOSIS — E876 Hypokalemia: Secondary | ICD-10-CM | POA: Diagnosis not present

## 2018-04-07 DIAGNOSIS — F17210 Nicotine dependence, cigarettes, uncomplicated: Secondary | ICD-10-CM | POA: Diagnosis not present

## 2018-04-07 DIAGNOSIS — R112 Nausea with vomiting, unspecified: Secondary | ICD-10-CM | POA: Diagnosis not present

## 2018-04-07 DIAGNOSIS — Z79899 Other long term (current) drug therapy: Secondary | ICD-10-CM | POA: Diagnosis not present

## 2018-04-08 DIAGNOSIS — R112 Nausea with vomiting, unspecified: Secondary | ICD-10-CM | POA: Diagnosis not present

## 2018-04-08 DIAGNOSIS — R7989 Other specified abnormal findings of blood chemistry: Secondary | ICD-10-CM | POA: Diagnosis not present

## 2018-04-08 DIAGNOSIS — Z79899 Other long term (current) drug therapy: Secondary | ICD-10-CM | POA: Diagnosis not present

## 2018-04-08 DIAGNOSIS — Z87891 Personal history of nicotine dependence: Secondary | ICD-10-CM | POA: Diagnosis not present

## 2018-07-10 DIAGNOSIS — R197 Diarrhea, unspecified: Secondary | ICD-10-CM | POA: Diagnosis not present

## 2018-07-10 DIAGNOSIS — Z87891 Personal history of nicotine dependence: Secondary | ICD-10-CM | POA: Diagnosis not present

## 2018-07-10 DIAGNOSIS — G43A0 Cyclical vomiting, not intractable: Secondary | ICD-10-CM | POA: Diagnosis not present

## 2018-07-10 DIAGNOSIS — R109 Unspecified abdominal pain: Secondary | ICD-10-CM | POA: Diagnosis not present

## 2018-07-10 DIAGNOSIS — R7989 Other specified abnormal findings of blood chemistry: Secondary | ICD-10-CM | POA: Diagnosis not present

## 2018-07-10 DIAGNOSIS — R110 Nausea: Secondary | ICD-10-CM | POA: Diagnosis not present

## 2018-07-10 DIAGNOSIS — R1115 Cyclical vomiting syndrome unrelated to migraine: Secondary | ICD-10-CM | POA: Diagnosis not present

## 2018-07-10 DIAGNOSIS — Z79899 Other long term (current) drug therapy: Secondary | ICD-10-CM | POA: Diagnosis not present

## 2018-07-11 DIAGNOSIS — R109 Unspecified abdominal pain: Secondary | ICD-10-CM | POA: Diagnosis not present

## 2018-07-11 DIAGNOSIS — R197 Diarrhea, unspecified: Secondary | ICD-10-CM | POA: Diagnosis not present

## 2018-07-12 DIAGNOSIS — N390 Urinary tract infection, site not specified: Secondary | ICD-10-CM | POA: Diagnosis not present

## 2018-07-12 DIAGNOSIS — Z87891 Personal history of nicotine dependence: Secondary | ICD-10-CM | POA: Diagnosis not present

## 2018-07-12 DIAGNOSIS — R1115 Cyclical vomiting syndrome unrelated to migraine: Secondary | ICD-10-CM | POA: Diagnosis not present

## 2018-07-12 DIAGNOSIS — G43A0 Cyclical vomiting, not intractable: Secondary | ICD-10-CM | POA: Diagnosis not present

## 2018-07-12 DIAGNOSIS — Z79899 Other long term (current) drug therapy: Secondary | ICD-10-CM | POA: Diagnosis not present

## 2018-07-12 DIAGNOSIS — B9689 Other specified bacterial agents as the cause of diseases classified elsewhere: Secondary | ICD-10-CM | POA: Diagnosis not present

## 2018-07-15 DIAGNOSIS — Z79899 Other long term (current) drug therapy: Secondary | ICD-10-CM | POA: Diagnosis not present

## 2018-07-15 DIAGNOSIS — R112 Nausea with vomiting, unspecified: Secondary | ICD-10-CM | POA: Diagnosis not present

## 2018-07-15 DIAGNOSIS — Z87891 Personal history of nicotine dependence: Secondary | ICD-10-CM | POA: Diagnosis not present

## 2018-07-15 DIAGNOSIS — R509 Fever, unspecified: Secondary | ICD-10-CM | POA: Diagnosis not present

## 2018-09-04 DIAGNOSIS — Z3202 Encounter for pregnancy test, result negative: Secondary | ICD-10-CM | POA: Diagnosis not present

## 2018-09-04 DIAGNOSIS — K3184 Gastroparesis: Secondary | ICD-10-CM | POA: Diagnosis not present

## 2018-09-04 DIAGNOSIS — R112 Nausea with vomiting, unspecified: Secondary | ICD-10-CM | POA: Diagnosis not present

## 2018-10-28 ENCOUNTER — Other Ambulatory Visit: Payer: Self-pay

## 2018-10-28 ENCOUNTER — Emergency Department (HOSPITAL_COMMUNITY)
Admission: EM | Admit: 2018-10-28 | Discharge: 2018-10-28 | Disposition: A | Discharge status: Home / Self Care | Payer: BC Managed Care – PPO | Attending: Emergency Medicine | Admitting: Emergency Medicine

## 2018-10-28 ENCOUNTER — Encounter (HOSPITAL_COMMUNITY): Payer: Self-pay | Admitting: Emergency Medicine

## 2018-10-28 DIAGNOSIS — Z87891 Personal history of nicotine dependence: Secondary | ICD-10-CM | POA: Insufficient documentation

## 2018-10-28 DIAGNOSIS — R112 Nausea with vomiting, unspecified: Secondary | ICD-10-CM | POA: Diagnosis present

## 2018-10-28 DIAGNOSIS — R1115 Cyclical vomiting syndrome unrelated to migraine: Secondary | ICD-10-CM | POA: Insufficient documentation

## 2018-10-28 LAB — COMPREHENSIVE METABOLIC PANEL
ALT: 21 U/L (ref 0–44)
AST: 30 U/L (ref 15–41)
Albumin: 4.4 g/dL (ref 3.5–5.0)
Alkaline Phosphatase: 62 U/L (ref 38–126)
Anion gap: 15 (ref 5–15)
BUN: 11 mg/dL (ref 6–20)
CO2: 16 mmol/L — ABNORMAL LOW (ref 22–32)
Calcium: 9.2 mg/dL (ref 8.9–10.3)
Chloride: 111 mmol/L (ref 98–111)
Creatinine, Ser: 0.85 mg/dL (ref 0.44–1.00)
GFR calc Af Amer: >60 mL/min (ref >60)
GFR calc non Af Amer: >60 mL/min (ref >60)
Glucose, Bld: 95 mg/dL (ref 70–99)
Potassium: 3.9 mmol/L (ref 3.5–5.1)
Sodium: 142 mmol/L (ref 135–145)
Total Bilirubin: 1.2 mg/dL (ref 0.3–1.2)
Total Protein: 7.7 g/dL (ref 6.5–8.1)

## 2018-10-28 LAB — CBC
HCT: 42.5 % (ref 36.0–46.0)
Hemoglobin: 13.6 g/dL (ref 12.0–15.0)
MCH: 31.9 pg (ref 26.0–34.0)
MCHC: 32 g/dL (ref 30.0–36.0)
MCV: 99.8 fL (ref 80.0–100.0)
Platelets: 97 10*3/uL — ABNORMAL LOW (ref 150–400)
RBC: 4.26 MIL/uL (ref 3.87–5.11)
RDW: 12.5 % (ref 11.5–15.5)
WBC: 13.3 10*3/uL — ABNORMAL HIGH (ref 4.0–10.5)
nRBC: 0 % (ref 0.0–0.2)

## 2018-10-28 LAB — LIPASE, BLOOD: Lipase: 23 U/L (ref 11–51)

## 2018-10-28 LAB — I-STAT BETA HCG BLOOD, ED (MC, WL, AP ONLY): I-stat hCG, quantitative: <5 m[IU]/mL (ref <5)

## 2018-10-28 MED ORDER — METOCLOPRAMIDE HCL 10 MG PO TABS: 10.0000 mg | ORAL_TABLET | Freq: Four times a day (QID) | ORAL | 0 refills | Status: DC

## 2018-10-28 MED ORDER — ONDANSETRON 8 MG PO TBDP: 8.0000 mg | ORAL_TABLET | Freq: Three times a day (TID) | ORAL | 0 refills | Status: DC | PRN

## 2018-10-28 MED ORDER — ONDANSETRON 4 MG PO TBDP
4.0000 mg | ORAL_TABLET | Freq: Once | ORAL | Status: AC | PRN
  Administered 2018-10-28: 4 mg via ORAL
  Filled 2018-10-28: qty 1

## 2018-10-28 MED ORDER — SODIUM CHLORIDE 0.9 % IV BOLUS
1000.0000 mL | Freq: Once | INTRAVENOUS | Status: AC
  Administered 2018-10-28: 1000 mL via INTRAVENOUS

## 2018-10-28 MED ORDER — DIPHENHYDRAMINE HCL 50 MG/ML IJ SOLN
25.0000 mg | Freq: Once | INTRAMUSCULAR | Status: AC
  Administered 2018-10-28: 25 mg via INTRAVENOUS
  Filled 2018-10-28: qty 1

## 2018-10-28 MED ORDER — DROPERIDOL 2.5 MG/ML IJ SOLN
2.5000 mg | Freq: Once | INTRAMUSCULAR | Status: AC
  Administered 2018-10-28: 2.5 mg via INTRAVENOUS
  Filled 2018-10-28: qty 2

## 2018-10-28 MED ORDER — PROMETHAZINE HCL 25 MG RE SUPP: 25.0000 mg | Freq: Four times a day (QID) | RECTAL | 0 refills | Status: DC | PRN

## 2018-10-28 MED ORDER — ALUM & MAG HYDROXIDE-SIMETH 200-200-20 MG/5ML PO SUSP: 15.0000 mL | Freq: Once | ORAL | Status: DC

## 2018-10-28 MED ORDER — ALUM & MAG HYDROXIDE-SIMETH 200-200-20 MG/5ML PO SUSP
30.0000 mL | Freq: Once | ORAL | Status: AC
  Administered 2018-10-28: 30 mL via ORAL
  Filled 2018-10-28: qty 30

## 2018-10-28 MED ORDER — METOCLOPRAMIDE HCL 5 MG/ML IJ SOLN
10.0000 mg | Freq: Once | INTRAMUSCULAR | Status: AC
  Administered 2018-10-28: 16:00:00 10 mg via INTRAVENOUS
  Filled 2018-10-28: qty 2

## 2018-10-28 NOTE — ED Notes (Signed)
Patient given gingerale for PO challenge.

## 2018-10-28 NOTE — ED Provider Notes (Signed)
[facility] Note   CSN: [id number] Arrival date & time: 10/28/18  1228    History   Chief Complaint Chief Complaint  Patient presents with  . Abdominal Pain  . Nausea  . Emesis    HPI Debra Thornton is a 23 y.o. female.     23 yo F with a chief complaint of nausea and vomiting.  Going on since this morning.  Unable to tolerate anything.  No fevers or chills no abdominal pain.  Has a history of the same.  The history is provided by the patient.  Abdominal Pain Associated symptoms: vomiting   Associated symptoms: no chest pain, no chills, no dysuria, no fever, no nausea and no shortness of breath   Emesis Associated symptoms: abdominal pain   Associated symptoms: no arthralgias, no chills, no fever, no headaches and no myalgias   Illness Severity:  Moderate Onset quality:  Gradual Duration:  8 hours Timing:  Constant Progression:  Worsening Chronicity:  New Associated symptoms: abdominal pain and vomiting   Associated symptoms: no chest pain, no congestion, no fever, no headaches, no myalgias, no nausea, no rhinorrhea, no shortness of breath and no wheezing     Past Medical History:  Diagnosis Date  . Colitis   . Esophageal yeast infection (HCC)   . Pancreatitis     Patient Active Problem List   Diagnosis Date Noted  . Hypokalemia 03/10/2016  . Acute kidney injury (HCC) 03/10/2016  . Nausea and vomiting 03/08/2016    Past Surgical History:  Procedure Laterality Date  . NO PAST SURGERIES       OB History   No obstetric history on file.      Home Medications    Prior to Admission medications   Medication Sig Start Date End Date Taking? Authorizing Provider  metoCLOPramide (REGLAN) 10 MG tablet Take 1 tablet (10 mg total) by mouth every 6 (six) hours for 3 days. Patient not taking: Reported on 10/28/2018 02/22/18 02/25/18  Aviva KluverMurray, Alyssa B, PA-C  pantoprazole (PROTONIX) 20 MG tablet Take 1 tablet (20 mg  total) by mouth daily. Patient not taking: Reported on 10/28/2018 02/22/18 03/24/18  Aviva KluverMurray, Alyssa B, PA-C  potassium chloride (K-DUR,KLOR-CON) 10 MEQ tablet Take 1 tablet (10 mEq total) by mouth daily. Patient not taking: Reported on 10/28/2018 02/23/18   Emi HolesLaw, Alexandra M, PA-C  promethazine (PHENERGAN) 25 MG tablet Take 1 tablet (25 mg total) by mouth every 6 (six) hours as needed for nausea or vomiting. Patient not taking: Reported on 10/28/2018 02/23/18   Emi HolesLaw, Alexandra M, PA-C  sucralfate (CARAFATE) 1 g tablet Take 1 tablet (1 g total) by mouth 4 (four) times daily -  with meals and at bedtime for 7 days. Patient not taking: Reported on 10/28/2018 02/22/18 03/01/18  Elisha PonderMurray, Alyssa B, PA-C    Family History Family History  Problem Relation Age of Onset  . Allergic rhinitis Father   . Asthma Father   . Urticaria Father   . Asthma Paternal Aunt   . Eczema Paternal Uncle   . Asthma Paternal Grandfather     Social History Social History   Tobacco Use  . Smoking status: Former Smoker    Types: Cigarettes  . Smokeless tobacco: Never Used  Substance Use Topics  . Alcohol use: No  . Drug use: Yes    Types: Marijuana     Allergies   Patient has no known allergies.   Review of Systems Review of Systems  Constitutional: Negative for chills and fever.  HENT: Negative for congestion and rhinorrhea.   Eyes: Negative for redness and visual disturbance.  Respiratory: Negative for shortness of breath and wheezing.   Cardiovascular: Negative for chest pain and palpitations.  Gastrointestinal: Positive for abdominal pain and vomiting. Negative for nausea.  Genitourinary: Negative for dysuria and urgency.  Musculoskeletal: Negative for arthralgias and myalgias.  Skin: Negative for pallor and wound.  Neurological: Negative for dizziness and headaches.     Physical Exam Updated Vital Signs BP 125/70   Pulse 67   Temp 98.3 F (36.8 C) (Oral)   Resp 12   Ht 5\' 4"  (1.626 m)   Wt  90.7 kg   LMP 10/07/2018 (Exact Date)   SpO2 100%   BMI 34.33 kg/m   Physical Exam Vitals signs and nursing note reviewed.  Constitutional:      General: She is not in acute distress.    Appearance: She is well-developed. She is not diaphoretic.  HENT:     Head: Normocephalic and atraumatic.  Eyes:     Pupils: Pupils are equal, round, and reactive to light.  Neck:     Musculoskeletal: Normal range of motion and neck supple.  Cardiovascular:     Rate and Rhythm: Normal rate and regular rhythm.     Heart sounds: No murmur. No friction rub. No gallop.   Pulmonary:     Effort: Pulmonary effort is normal.     Breath sounds: No wheezing or rales.  Abdominal:     General: There is no distension.     Palpations: Abdomen is soft.     Tenderness: There is no abdominal tenderness.     Comments: Benign abdominal exam  Musculoskeletal:        General: No tenderness.  Skin:    General: Skin is warm and dry.  Neurological:     Mental Status: She is alert and oriented to person, place, and time.  Psychiatric:        Behavior: Behavior normal.      ED Treatments / Results  Labs (all labs ordered are listed, but only abnormal results are displayed) Labs Reviewed  CBC - Abnormal; Notable for the following components:      Result Value   WBC 13.3 (*)    Platelets 97 (*)    All other components within normal limits  COMPREHENSIVE METABOLIC PANEL - Abnormal; Notable for the following components:   CO2 16 (*)    All other components within normal limits  LIPASE, BLOOD  URINALYSIS, ROUTINE W REFLEX MICROSCOPIC  I-STAT BETA HCG BLOOD, ED ([facility], [facility], [facility] ONLY)    EKG None  Radiology No results found.  Procedures Procedures (including critical care time)  Medications Ordered in ED Medications  alum & mag hydroxide-simeth (MAALOX/MYLANTA) 200-200-20 MG/5ML suspension 30 mL (has no administration in time range)  metoCLOPramide (REGLAN) injection 10 mg (has no administration in  time range)  ondansetron (ZOFRAN-ODT) disintegrating tablet 4 mg (4 mg Oral Given 10/28/18 1247)  droperidol (INAPSINE) 2.5 MG/ML injection 2.5 mg (2.5 mg Intravenous Given 10/28/18 1328)  diphenhydrAMINE (BENADRYL) injection 25 mg (25 mg Intravenous Given 10/28/18 1328)  sodium chloride 0.9 % bolus 1,000 mL (1,000 mLs Intravenous New Bag/Given 10/28/18 1328)     Initial Impression / Assessment and Plan / ED Course  I have reviewed the triage vital signs and the nursing notes.  Pertinent labs & imaging results that were available during my care of the patient were reviewed  by me and considered in my medical decision making (see chart for details).        23 yo F with a chief complaint of nausea and vomiting.  Patient has a history of the same.  Has been seen multiple times in the ED in the past though it has been greater than 6 months since her last visit.  She thinks this feels the same.  No abdominal tenderness on my exam.  Will give a fluid bolus nausea medicine and reassess.  Patient is feeling somewhat better but not resolved.  She has a mild non-anion gap acidosis.  No LFT or lipase elevation.  Will give an oral trial and give another dose of an antiemetic.  The patients results and plan were reviewed and discussed.   Any x-rays performed were independently reviewed by myself.   Differential diagnosis were considered with the presenting HPI.  Medications  alum & mag hydroxide-simeth (MAALOX/MYLANTA) 200-200-20 MG/5ML suspension 30 mL (has no administration in time range)  metoCLOPramide (REGLAN) injection 10 mg (has no administration in time range)  ondansetron (ZOFRAN-ODT) disintegrating tablet 4 mg (4 mg Oral Given 10/28/18 1247)  droperidol (INAPSINE) 2.5 MG/ML injection 2.5 mg (2.5 mg Intravenous Given 10/28/18 1328)  diphenhydrAMINE (BENADRYL) injection 25 mg (25 mg Intravenous Given 10/28/18 1328)  sodium chloride 0.9 % bolus 1,000 mL (1,000 mLs Intravenous New Bag/Given 10/28/18  1328)    Vitals:   10/28/18 1244 10/28/18 1247 10/28/18 1500  BP:  137/89 125/70  Pulse:  69 67  Resp:   12  Temp:  98.3 F (36.8 C)   TempSrc:  Oral   SpO2:  100% 100%  Weight: 90.7 kg    Height: 5\' 4"  (1.626 m)      Final diagnoses:  Cyclic vomiting syndrome      Final Clinical Impressions(s) / ED Diagnoses   Final diagnoses:  Cyclic vomiting syndrome    ED Discharge Orders    None       Deno Etienne, DO 10/28/18 1541

## 2018-10-28 NOTE — ED Notes (Signed)
Patient verbalizes understanding of discharge instructions. Opportunity for questioning and answers were provided. Armband removed by staff, pt discharged from ED ambulatory.   

## 2018-10-28 NOTE — ED Triage Notes (Signed)
Pt reports sudden onset this morning of abd pain, nausea, and vomiting. Pt reports this all started at 0800 hrs this date.

## 2018-10-28 NOTE — Discharge Instructions (Addendum)
Please return to the ER if your symptoms worsen; you have increased pain, fevers, chills, inability to keep any medications down, confusion. Otherwise see the outpatient doctor as requested.  

## 2018-10-29 ENCOUNTER — Emergency Department (HOSPITAL_COMMUNITY)
Admission: EM | Admit: 2018-10-29 | Discharge: 2018-10-30 | Disposition: A | Discharge status: Home / Self Care | Payer: BC Managed Care – PPO | Attending: Emergency Medicine | Admitting: Emergency Medicine

## 2018-10-29 ENCOUNTER — Other Ambulatory Visit: Payer: Self-pay

## 2018-10-29 DIAGNOSIS — Z87891 Personal history of nicotine dependence: Secondary | ICD-10-CM | POA: Diagnosis not present

## 2018-10-29 DIAGNOSIS — R1115 Cyclical vomiting syndrome unrelated to migraine: Secondary | ICD-10-CM | POA: Diagnosis not present

## 2018-10-29 DIAGNOSIS — E876 Hypokalemia: Secondary | ICD-10-CM | POA: Insufficient documentation

## 2018-10-29 DIAGNOSIS — R1084 Generalized abdominal pain: Secondary | ICD-10-CM | POA: Insufficient documentation

## 2018-10-29 DIAGNOSIS — R1110 Vomiting, unspecified: Secondary | ICD-10-CM | POA: Diagnosis present

## 2018-10-29 LAB — CBC
HCT: 41.2 % (ref 36.0–46.0)
Hemoglobin: 14.2 g/dL (ref 12.0–15.0)
MCH: 32.1 pg (ref 26.0–34.0)
MCHC: 34.5 g/dL (ref 30.0–36.0)
MCV: 93.2 fL (ref 80.0–100.0)
Platelets: 238 10*3/uL (ref 150–400)
RBC: 4.42 MIL/uL (ref 3.87–5.11)
RDW: 12.3 % (ref 11.5–15.5)
WBC: 12.2 10*3/uL — ABNORMAL HIGH (ref 4.0–10.5)
nRBC: 0 % (ref 0.0–0.2)

## 2018-10-29 LAB — URINALYSIS, ROUTINE W REFLEX MICROSCOPIC
Bilirubin Urine: NEGATIVE
Glucose, UA: NEGATIVE mg/dL
Hgb urine dipstick: NEGATIVE
Ketones, ur: 80 mg/dL — AB
Nitrite: NEGATIVE
Protein, ur: 100 mg/dL — AB
Specific Gravity, Urine: 1.033 — ABNORMAL HIGH (ref 1.005–1.030)
pH: 5 (ref 5.0–8.0)

## 2018-10-29 LAB — COMPREHENSIVE METABOLIC PANEL
ALT: 22 U/L (ref 0–44)
AST: 28 U/L (ref 15–41)
Albumin: 4.7 g/dL (ref 3.5–5.0)
Alkaline Phosphatase: 65 U/L (ref 38–126)
Anion gap: 15 (ref 5–15)
BUN: 8 mg/dL (ref 6–20)
CO2: 16 mmol/L — ABNORMAL LOW (ref 22–32)
Calcium: 9.7 mg/dL (ref 8.9–10.3)
Chloride: 104 mmol/L (ref 98–111)
Creatinine, Ser: 1.04 mg/dL — ABNORMAL HIGH (ref 0.44–1.00)
GFR calc Af Amer: >60 mL/min (ref >60)
GFR calc non Af Amer: >60 mL/min (ref >60)
Glucose, Bld: 123 mg/dL — ABNORMAL HIGH (ref 70–99)
Potassium: 2.7 mmol/L — CL (ref 3.5–5.1)
Sodium: 135 mmol/L (ref 135–145)
Total Bilirubin: 1 mg/dL (ref 0.3–1.2)
Total Protein: 8.3 g/dL — ABNORMAL HIGH (ref 6.5–8.1)

## 2018-10-29 LAB — LIPASE, BLOOD: Lipase: 36 U/L (ref 11–51)

## 2018-10-29 LAB — I-STAT BETA HCG BLOOD, ED (MC, WL, AP ONLY): I-stat hCG, quantitative: <5 m[IU]/mL (ref <5)

## 2018-10-29 MED ORDER — SODIUM CHLORIDE 0.9% FLUSH
3.0000 mL | Freq: Once | INTRAVENOUS | Status: AC
  Administered 2018-10-29: 3 mL via INTRAVENOUS

## 2018-10-29 MED ORDER — SODIUM CHLORIDE 0.9 % IV BOLUS: 1000.0000 mL | Freq: Once | INTRAVENOUS | Status: DC

## 2018-10-29 MED ORDER — POTASSIUM CHLORIDE CRYS ER 20 MEQ PO TBCR
40.0000 meq | EXTENDED_RELEASE_TABLET | Freq: Once | ORAL | Status: AC
  Administered 2018-10-30: 40 meq via ORAL
  Filled 2018-10-29: qty 2

## 2018-10-29 MED ORDER — METOCLOPRAMIDE HCL 5 MG/ML IJ SOLN
10.0000 mg | Freq: Once | INTRAMUSCULAR | Status: AC
  Administered 2018-10-29: 10 mg via INTRAVENOUS
  Filled 2018-10-29: qty 2

## 2018-10-29 MED ORDER — LORAZEPAM 2 MG/ML IJ SOLN
1.0000 mg | Freq: Once | INTRAMUSCULAR | Status: AC
  Administered 2018-10-29: 1 mg via INTRAVENOUS
  Filled 2018-10-29: qty 1

## 2018-10-29 MED ORDER — POTASSIUM CHLORIDE IN NACL 20-0.45 MEQ/L-% IV SOLN
INTRAVENOUS | Status: DC
  Administered 2018-10-29: 20:00:00 via INTRAVENOUS
  Filled 2018-10-29 (×2): qty 1000

## 2018-10-29 NOTE — Discharge Instructions (Addendum)
As discussed, your evaluation today has been largely reassuring.  But, it is important that you monitor your condition carefully, and do not hesitate to return to the ED if you develop new, or concerning changes in your condition. ? ?Otherwise, please follow-up with your physician for appropriate ongoing care. ? ?

## 2018-10-29 NOTE — ED Provider Notes (Signed)
11:00 PM  Assumed care from Dr. Vanita Panda.  Patient is a 23 y.o. F with history of cyclic vomiting.  Here with vomiting.  K+ is 2.7.  Getting IVF and potassium Po challenge pending.  Doing better after IV Reglan.  Patient sleeping.  1:30 AM  Patient reports she vomited in the bathroom.  Will give dose of IV Haldol.  2:25 AM  Patient reports feeling better.  Able to tolerate p.o.  Will discharge home with Reglan and potassium.  At this time, I do not feel there is any life-threatening condition present. I have reviewed and discussed all results (EKG, imaging, lab, urine as appropriate) and exam findings with patient/family. I have reviewed nursing notes and appropriate previous records.  I feel the patient is safe to be discharged home without further emergent workup and can continue workup as an outpatient as needed. Discussed usual and customary return precautions. Patient/family verbalize understanding and are comfortable with this plan.  Outpatient follow-up has been provided as needed. All questions have been answered.    Gumaro Brightbill, Delice Bison, DO 10/30/18 0225

## 2018-10-29 NOTE — ED Notes (Signed)
EDP Lockwood at bedside attempting [place] IV.

## 2018-10-29 NOTE — ED Notes (Signed)
Mother Clarene Critchley left number for updates.  [phone]

## 2018-10-29 NOTE — ED Provider Notes (Signed)
[facility] EMERGENCY DEPARTMENT Provider Note   CSN: [id number] Arrival date & time: 10/29/18  1715    History   Chief Complaint No chief complaint on file.   HPI Debra Thornton is a 23 y.o. female.     HPI Presents after being evaluated yesterday for similar concerns.  She has a history of cyclic nausea, vomiting, states that after discharge yesterday she was feeling better. However, overnight she became intolerant of oral intake, in spite of using oral and rectal medication. Patient denies fever, cough, chest pain, dyspnea. There is some abdominal discomfort, without focality. Pain is sore, diffuse.  Given, no medication taken for relief as she is intolerant of oral intake.   Past Medical History:  Diagnosis Date  . Colitis   . Esophageal yeast infection ([place])   . Pancreatitis     Patient Active Problem List   Diagnosis Date Noted  . Hypokalemia 03/10/2016  . Acute kidney injury ([place]) 03/10/2016  . Nausea and vomiting 03/08/2016    Past Surgical History:  Procedure Laterality Date  . NO PAST SURGERIES       OB History   No obstetric history on file.      Home Medications    Prior to Admission medications   Medication Sig Start Date End Date Taking? Authorizing Provider  metoCLOPramide (REGLAN) 10 MG tablet Take 1 tablet (10 mg total) by mouth every 6 (six) hours for 3 days. 10/28/18 10/31/18  Varney Biles, MD  ondansetron (ZOFRAN ODT) 8 MG disintegrating tablet Take 1 tablet (8 mg total) by mouth every 8 (eight) hours as needed for nausea. 10/28/18   Varney Biles, MD  pantoprazole (PROTONIX) 20 MG tablet Take 1 tablet (20 mg total) by mouth daily. Patient not taking: Reported on 10/28/2018 02/22/18 03/24/18  Langston Masker B, PA-C  potassium chloride (K-DUR,KLOR-CON) 10 MEQ tablet Take 1 tablet (10 mEq total) by mouth daily. Patient not taking: Reported on 10/28/2018 02/23/18   Frederica Kuster, PA-C  promethazine (PHENERGAN) 25 MG  suppository Place 1 suppository (25 mg total) rectally every 6 (six) hours as needed for nausea or vomiting. 10/28/18   Varney Biles, MD  sucralfate (CARAFATE) 1 g tablet Take 1 tablet (1 g total) by mouth 4 (four) times daily -  with meals and at bedtime for 7 days. Patient not taking: Reported on 10/28/2018 02/22/18 03/01/18  Albesa Seen, PA-C    Family History Family History  Problem Relation Age of Onset  . Allergic rhinitis Father   . Asthma Father   . Urticaria Father   . Asthma Paternal Aunt   . Eczema Paternal Uncle   . Asthma Paternal Grandfather     Social History Social History   Tobacco Use  . Smoking status: Former Smoker    Types: Cigarettes  . Smokeless tobacco: Never Used  Substance Use Topics  . Alcohol use: No  . Drug use: Yes    Types: Marijuana     Allergies   Patient has no known allergies.   Review of Systems Review of Systems  Constitutional:       Per HPI, otherwise negative  HENT:       Per HPI, otherwise negative  Respiratory:       Per HPI, otherwise negative  Cardiovascular:       Per HPI, otherwise negative  Gastrointestinal: Positive for abdominal pain, nausea and vomiting.  Endocrine:       Negative aside from HPI  Genitourinary:  Neg aside from HPI   Musculoskeletal:       Per HPI, otherwise negative  Skin: Negative.   Neurological: Negative for syncope.     Physical Exam Updated Vital Signs BP 108/63 (BP Location: Right Arm)   Pulse 71   Temp 98.9 F (37.2 C) (Oral)   Resp 20   Ht 5\' 4"  (1.626 m)   Wt 90.7 kg   LMP 10/07/2018 (Exact Date)   SpO2 98%   BMI 34.32 kg/m   Physical Exam Vitals signs and nursing note reviewed.  Constitutional:      General: She is not in acute distress.    Appearance: She is well-developed.  HENT:     Head: Normocephalic and atraumatic.  Eyes:     Conjunctiva/sclera: Conjunctivae normal.  Cardiovascular:     Rate and Rhythm: Normal rate and regular rhythm.   Pulmonary:     Effort: Pulmonary effort is normal. No respiratory distress.     Breath sounds: Normal breath sounds. No stridor.  Abdominal:     General: There is no distension.     Tenderness: There is no abdominal tenderness. There is no guarding.  Skin:    General: Skin is warm and dry.  Neurological:     Mental Status: She is alert and oriented to person, place, and time.     Cranial Nerves: No cranial nerve deficit.      ED Treatments / Results  Labs (all labs ordered are listed, but only abnormal results are displayed) Labs Reviewed  COMPREHENSIVE METABOLIC PANEL - Abnormal; Notable for the following components:      Result Value   Potassium 2.7 (*)    CO2 16 (*)    Glucose, Bld 123 (*)    Creatinine, Ser 1.04 (*)    Total Protein 8.3 (*)    All other components within normal limits  CBC - Abnormal; Notable for the following components:   WBC 12.2 (*)    All other components within normal limits  LIPASE, BLOOD  URINALYSIS, ROUTINE W REFLEX MICROSCOPIC  I-STAT BETA HCG BLOOD, ED ([facility], [facility], [facility] ONLY)   Procedures Procedures (including critical care time)  Medications Ordered in ED Medications  0.45 % NaCl with KCl 20 mEq / L infusion ( Intravenous Stopped 10/29/18 2153)  sodium chloride flush (NS) 0.9 % injection 3 mL (3 mLs Intravenous Given 10/29/18 1759)  metoCLOPramide (REGLAN) injection 10 mg (10 mg Intravenous Given 10/29/18 1937)  LORazepam (ATIVAN) injection 1 mg (1 mg Intravenous Given 10/29/18 1938)     Initial Impression / Assessment and Plan / ED Course  I have reviewed the triage vital signs and the nursing notes.  Pertinent labs & imaging results that were available during my care of the patient were reviewed by me and considered in my medical decision making (see chart for details).    EMR review performed, notable for visit yesterday, with labs generally reassuring, including negative pregnancy test.    11:26 PM Patient sleeping, awakens easily,  no additional vomiting for several hours now. She is completed her potassium repletion, fluid repletion. With otherwise reassuring findings if the patient tolerates oral intake, has no additional vomiting she may be appropriate for discharge with outpatient follow-up. Dr. Elesa MassedWard is aware of the patient.  Final Clinical Impressions(s) / ED Diagnoses  Nausea and vomiting hypokalemia   Gerhard MunchLockwood, Antonisha Waskey, MD 10/29/18 2328

## 2018-10-30 ENCOUNTER — Emergency Department (HOSPITAL_COMMUNITY)
Admission: EM | Admit: 2018-10-30 | Discharge: 2018-10-30 | Disposition: A | Discharge status: Home / Self Care | Payer: BC Managed Care – PPO | Source: Home / Self Care | Attending: Emergency Medicine | Admitting: Emergency Medicine

## 2018-10-30 ENCOUNTER — Other Ambulatory Visit: Payer: Self-pay

## 2018-10-30 DIAGNOSIS — Z87891 Personal history of nicotine dependence: Secondary | ICD-10-CM | POA: Insufficient documentation

## 2018-10-30 DIAGNOSIS — R1115 Cyclical vomiting syndrome unrelated to migraine: Secondary | ICD-10-CM | POA: Insufficient documentation

## 2018-10-30 DIAGNOSIS — R1084 Generalized abdominal pain: Secondary | ICD-10-CM | POA: Insufficient documentation

## 2018-10-30 LAB — COMPREHENSIVE METABOLIC PANEL
ALT: 20 U/L (ref 0–44)
AST: 25 U/L (ref 15–41)
Albumin: 4.5 g/dL (ref 3.5–5.0)
Alkaline Phosphatase: 64 U/L (ref 38–126)
Anion gap: 12 (ref 5–15)
BUN: 9 mg/dL (ref 6–20)
CO2: 19 mmol/L — ABNORMAL LOW (ref 22–32)
Calcium: 9.7 mg/dL (ref 8.9–10.3)
Chloride: 107 mmol/L (ref 98–111)
Creatinine, Ser: 0.89 mg/dL (ref 0.44–1.00)
GFR calc Af Amer: >60 mL/min (ref >60)
GFR calc non Af Amer: >60 mL/min (ref >60)
Glucose, Bld: 109 mg/dL — ABNORMAL HIGH (ref 70–99)
Potassium: 2.9 mmol/L — ABNORMAL LOW (ref 3.5–5.1)
Sodium: 138 mmol/L (ref 135–145)
Total Bilirubin: 1.1 mg/dL (ref 0.3–1.2)
Total Protein: 7.7 g/dL (ref 6.5–8.1)

## 2018-10-30 LAB — URINALYSIS, COMPLETE (UACMP) WITH MICROSCOPIC
Glucose, UA: 50 mg/dL — AB
Hgb urine dipstick: NEGATIVE
Ketones, ur: 20 mg/dL — AB
Nitrite: NEGATIVE
Protein, ur: >=300 mg/dL — AB
Specific Gravity, Urine: 1.033 — ABNORMAL HIGH (ref 1.005–1.030)
WBC, UA: >50 WBC/hpf — ABNORMAL HIGH (ref 0–5)
pH: 5 (ref 5.0–8.0)

## 2018-10-30 LAB — CBC WITH DIFFERENTIAL/PLATELET
Abs Immature Granulocytes: 0.05 10*3/uL (ref 0.00–0.07)
Basophils Absolute: 0.1 10*3/uL (ref 0.0–0.1)
Basophils Relative: 0 %
Eosinophils Absolute: 0 10*3/uL (ref 0.0–0.5)
Eosinophils Relative: 0 %
HCT: 38.9 % (ref 36.0–46.0)
Hemoglobin: 13.5 g/dL (ref 12.0–15.0)
Immature Granulocytes: 0 %
Lymphocytes Relative: 21 %
Lymphs Abs: 2.9 10*3/uL (ref 0.7–4.0)
MCH: 31.8 pg (ref 26.0–34.0)
MCHC: 34.7 g/dL (ref 30.0–36.0)
MCV: 91.7 fL (ref 80.0–100.0)
Monocytes Absolute: 1.5 10*3/uL — ABNORMAL HIGH (ref 0.1–1.0)
Monocytes Relative: 11 %
Neutro Abs: 9.6 10*3/uL — ABNORMAL HIGH (ref 1.7–7.7)
Neutrophils Relative %: 68 %
Platelets: 236 10*3/uL (ref 150–400)
RBC: 4.24 MIL/uL (ref 3.87–5.11)
RDW: 12.5 % (ref 11.5–15.5)
WBC: 14.2 10*3/uL — ABNORMAL HIGH (ref 4.0–10.5)
nRBC: 0 % (ref 0.0–0.2)

## 2018-10-30 LAB — I-STAT BETA HCG BLOOD, ED (MC, WL, AP ONLY): I-stat hCG, quantitative: <5 m[IU]/mL (ref <5)

## 2018-10-30 LAB — MAGNESIUM: Magnesium: 1.9 mg/dL (ref 1.7–2.4)

## 2018-10-30 LAB — LIPASE, BLOOD: Lipase: 30 U/L (ref 11–51)

## 2018-10-30 MED ORDER — HALOPERIDOL LACTATE 5 MG/ML IJ SOLN
5.0000 mg | Freq: Once | INTRAMUSCULAR | Status: AC
  Administered 2018-10-30: 5 mg via INTRAVENOUS
  Filled 2018-10-30: qty 1

## 2018-10-30 MED ORDER — POTASSIUM CHLORIDE CRYS ER 20 MEQ PO TBCR
40.0000 meq | EXTENDED_RELEASE_TABLET | Freq: Once | ORAL | Status: AC
  Administered 2018-10-30: 40 meq via ORAL
  Filled 2018-10-30: qty 2

## 2018-10-30 MED ORDER — SODIUM CHLORIDE 0.9 % IV BOLUS
1000.0000 mL | Freq: Once | INTRAVENOUS | Status: AC
  Administered 2018-10-30: 1000 mL via INTRAVENOUS

## 2018-10-30 MED ORDER — PANTOPRAZOLE SODIUM 40 MG IV SOLR
40.0000 mg | Freq: Once | INTRAVENOUS | Status: AC
  Administered 2018-10-30: 40 mg via INTRAVENOUS
  Filled 2018-10-30: qty 40

## 2018-10-30 MED ORDER — HALOPERIDOL LACTATE 5 MG/ML IJ SOLN
2.0000 mg | Freq: Once | INTRAMUSCULAR | Status: AC
  Administered 2018-10-30: 2 mg via INTRAVENOUS
  Filled 2018-10-30: qty 1

## 2018-10-30 MED ORDER — METOCLOPRAMIDE HCL 10 MG PO TABS: 10.0000 mg | ORAL_TABLET | Freq: Three times a day (TID) | ORAL | 0 refills | Status: DC | PRN

## 2018-10-30 MED ORDER — POTASSIUM CHLORIDE CRYS ER 20 MEQ PO TBCR
40.0000 meq | EXTENDED_RELEASE_TABLET | Freq: Once | ORAL | Status: AC
  Administered 2018-10-30: 12:00:00 40 meq via ORAL
  Filled 2018-10-30: qty 2

## 2018-10-30 MED ORDER — DIPHENHYDRAMINE HCL 50 MG/ML IJ SOLN
12.5000 mg | Freq: Once | INTRAMUSCULAR | Status: AC
  Administered 2018-10-30: 12.5 mg via INTRAVENOUS
  Filled 2018-10-30: qty 1

## 2018-10-30 MED ORDER — POTASSIUM CHLORIDE CRYS ER 20 MEQ PO TBCR: 20.0000 meq | EXTENDED_RELEASE_TABLET | Freq: Two times a day (BID) | ORAL | 0 refills | Status: DC

## 2018-10-30 MED ORDER — POTASSIUM CHLORIDE 10 MEQ/100ML IV SOLN
10.0000 meq | Freq: Once | INTRAVENOUS | Status: AC
  Administered 2018-10-30: 10 meq via INTRAVENOUS
  Filled 2018-10-30: qty 100

## 2018-10-30 NOTE — ED Provider Notes (Addendum)
[facility] Note   CSN: [id number] Arrival date & time: 10/30/18  [id number]    History   Chief Complaint No chief complaint on file.   HPI Lorraine LaxJa'la L Cassidy is a 23 y.o. female.     22yo female with history of cyclic vomiting syndrome, seen here for the two days prior for same, left early this morning now with return of vomiting. Has taken Reglan this morning without relief. Has not tried phenergan or zofran. Denies weakness, muscle spasms or cramps, fevers, chills, abdominal pain. Reports a few pink specks in last emesis, no significant bleeding reported. Last bowel movement 2 days ago, states it was loose. No known sick contacts. Last smoked marijuana last week. Given IV fluid with K IV and PO yesterday for K 2.7. Last admitted to the hospital for same last year.      Past Medical History:  Diagnosis Date   Colitis    Esophageal yeast infection (HCC)    Pancreatitis     Patient Active Problem List   Diagnosis Date Noted   Hypokalemia 03/10/2016   Acute kidney injury (HCC) 03/10/2016   Nausea and vomiting 03/08/2016    Past Surgical History:  Procedure Laterality Date   NO PAST SURGERIES       OB History   No obstetric history on file.      Home Medications    Prior to Admission medications   Medication Sig Start Date End Date Taking? Authorizing Provider  metoCLOPramide (REGLAN) 10 MG tablet Take 1 tablet (10 mg total) by mouth every 8 (eight) hours as needed for nausea. 10/30/18   Ward, Layla MawKristen N, DO  potassium chloride SA (K-DUR) 20 MEQ tablet Take 1 tablet (20 mEq total) by mouth 2 (two) times daily. 10/30/18   Ward, Layla MawKristen N, DO  pantoprazole (PROTONIX) 20 MG tablet Take 1 tablet (20 mg total) by mouth daily. Patient not taking: Reported on 10/30/2018 02/22/18 10/30/18  Elisha PonderMurray, Alyssa B, PA-C  promethazine (PHENERGAN) 25 MG suppository Place 1 suppository (25 mg total) rectally every 6 (six) hours as needed for  nausea or vomiting. Patient not taking: Reported on 10/30/2018 10/28/18 10/30/18  Derwood KaplanNanavati, Ankit, MD  sucralfate (CARAFATE) 1 g tablet Take 1 tablet (1 g total) by mouth 4 (four) times daily -  with meals and at bedtime for 7 days. Patient not taking: Reported on 10/30/2018 02/22/18 10/30/18  Elisha PonderMurray, Alyssa B, PA-C    Family History Family History  Problem Relation Age of Onset   Allergic rhinitis Father    Asthma Father    Urticaria Father    Asthma Paternal Aunt    Eczema Paternal Uncle    Asthma Paternal Grandfather     Social History Social History   Tobacco Use   Smoking status: Former Smoker    Types: Cigarettes   Smokeless tobacco: Never Used  Substance Use Topics   Alcohol use: No   Drug use: Yes    Types: Marijuana     Allergies   Patient has no known allergies.   Review of Systems Review of Systems  Constitutional: Negative for chills, diaphoresis and fever.  Respiratory: Negative for shortness of breath.   Cardiovascular: Negative for chest pain.  Gastrointestinal: Positive for abdominal pain, nausea and vomiting. Negative for constipation and diarrhea.       States feels sore from frequent vomiting.  Genitourinary: Negative for difficulty urinating, dysuria, frequency and urgency.  Musculoskeletal: Negative for arthralgias and myalgias.  Skin:  Negative for rash and wound.  Neurological: Negative for dizziness, weakness and numbness.  Psychiatric/Behavioral: Negative for confusion.  All other systems reviewed and are negative.    Physical Exam Updated Vital Signs BP 122/69 (BP Location: Left Arm)    Pulse 86    Temp 98.3 F (36.8 C) (Oral)    Resp 15    LMP 10/07/2018 (Exact Date)    SpO2 100%   Physical Exam Vitals signs and nursing note reviewed.  Constitutional:      General: She is not in acute distress.    Appearance: She is well-developed. She is not diaphoretic.  HENT:     Head: Normocephalic and atraumatic.  Cardiovascular:      Rate and Rhythm: Normal rate and regular rhythm.     Pulses: Normal pulses.     Heart sounds: Normal heart sounds.  Pulmonary:     Effort: Pulmonary effort is normal.     Breath sounds: Normal breath sounds.  Abdominal:     Palpations: Abdomen is soft.     Tenderness: There is generalized abdominal tenderness.     Comments: Mild generalized abdominal tenderness.  Musculoskeletal:     Right lower leg: No edema.     Left lower leg: No edema.  Skin:    General: Skin is warm and dry.     Findings: No erythema or rash.  Neurological:     Mental Status: She is alert and oriented to person, place, and time.  Psychiatric:        Behavior: Behavior normal.      ED Treatments / Results  Labs (all labs ordered are listed, but only abnormal results are displayed) Labs Reviewed  CBC WITH DIFFERENTIAL/PLATELET - Abnormal; Notable for the following components:      Result Value   WBC 14.2 (*)    Neutro Abs 9.6 (*)    Monocytes Absolute 1.5 (*)    All other components within normal limits  COMPREHENSIVE METABOLIC PANEL - Abnormal; Notable for the following components:   Potassium 2.9 (*)    CO2 19 (*)    Glucose, Bld 109 (*)    All other components within normal limits  URINALYSIS, COMPLETE (UACMP) WITH MICROSCOPIC - Abnormal; Notable for the following components:   Color, Urine AMBER (*)    APPearance TURBID (*)    Specific Gravity, Urine 1.033 (*)    Glucose, UA 50 (*)    Bilirubin Urine SMALL (*)    Ketones, ur 20 (*)    Protein, ur >=300 (*)    Leukocytes,Ua MODERATE (*)    WBC, UA >50 (*)    Bacteria, UA MANY (*)    All other components within normal limits  LIPASE, BLOOD  MAGNESIUM  I-STAT BETA HCG BLOOD, ED ([facility], [facility], [facility] ONLY)    EKG EKG Interpretation  Date/Time:  Saturday October 30 2018 11:51:47 EDT Ventricular Rate:  72 PR Interval:    QRS Duration: 90 QT Interval:  405 QTC Calculation: 444 R Axis:   89 Text Interpretation:  Sinus arrhythmia Borderline Q waves  in lateral leads Borderline T abnormalities, inferior leads Baseline wander in lead(s) I III aVL V1 V2 V3 V5 Confirmed by Sherwood Gambler [phone]) on 10/30/2018 1:11:59 PM   Radiology No results found.  Procedures Procedures (including critical care time)  Medications Ordered in ED Medications  haloperidol lactate (HALDOL) injection 5 mg (5 mg Intravenous Given 10/30/18 1330)  diphenhydrAMINE (BENADRYL) injection 12.5 mg (12.5 mg Intravenous Given 10/30/18 1049)  sodium chloride 0.9 % bolus 1,000 mL (0 mLs Intravenous Stopped 10/30/18 1215)  pantoprazole (PROTONIX) injection 40 mg (40 mg Intravenous Given 10/30/18 1130)  potassium chloride SA (K-DUR) CR tablet 40 mEq (40 mEq Oral Given 10/30/18 1200)  potassium chloride 10 mEq in 100 mL IVPB (0 mEq Intravenous Stopped 10/30/18 1330)  potassium chloride SA (K-DUR) CR tablet 40 mEq (40 mEq Oral Given 10/30/18 1511)     Initial Impression / Assessment and Plan / ED Course  I have reviewed the triage vital signs and the nursing notes.  Pertinent labs & imaging results that were available during my care of the patient were reviewed by me and considered in my medical decision making (see chart for details).  Clinical Course as of Oct 29 1605  Sat Oct 30, 2018  [id number] 23 year old female with cannabinoid hyperemesis presents with ongoing nausea and vomiting, seen in the past 2 days in the emergency room for same.  Patient returns today, discharged this morning, ongoing vomiting.  Patient is ambulatory with a steady gait, without distress to the restroom on multiple occasions for emesis.  States there is bright pink flecks in her emesis, denies large amounts of blood.  Reports last bowel movement was 2 days ago, reported to be loose nonbloody.  Patient was given 20 mEq potassium IV yesterday for potassium of 2.7.  Potassium today is 2.9 with bicarb of 19, no anion gap, normal renal function.  hCG negative, magnesium within normal limits, lipase negative.   CBC 14,000 today, no significant increase when compared to previous 2 days labs.  Urinalysis appears contaminated, no urinary complaints.  EKG with normal QT.  Haldol and IV fluids ordered for her vomiting however was held due to pharmacy recommendation for hypokalemia.  Patient was given p.o. potassium without antiemetics and vomited.  Patient is currently getting 10 mEq potassium IV.   [LM]  1330 EKG reviewed with Dr. Criss AlvineGoldston, QT is not prolonged, patient may receive Haldol for her nausea and vomiting.   [LM]  1443 Patient feeling better, afraid to go home and have return of symptoms. Tolerating small sips of fluid, will try PO K again (vomited pill on first attempt prior to haldol).   [LM]  1559 Patient continues to tolerate small sips of water, now trying crackers.  If patient is able to tolerate crackers she may be discharged home.  Patient reports having enough Reglan, Zofran, Phenergan at home to manage her symptoms.   [LM]    Clinical Course User Index [LM] Jeannie FendMurphy, Susann Lawhorne A, PA-C      Final Clinical Impressions(s) / ED Diagnoses   Final diagnoses:  Cyclic vomiting syndrome    ED Discharge Orders    None       Jeannie FendMurphy, Natina Wiginton A, PA-C 10/30/18 1600    Jeannie FendMurphy, Eldon Zietlow A, PA-C 10/30/18 1607    Pricilla LovelessGoldston, Scott, MD 11/02/18 [phone]

## 2018-10-30 NOTE — ED Triage Notes (Signed)
Pt reports N/V X2 days with no relief. Denies fever

## 2018-10-30 NOTE — ED Notes (Signed)
Pt reports vomiting x 1 in the bathroom; no visible emesis in bag at bedside

## 2018-10-30 NOTE — ED Notes (Signed)
Pt provided with saltines and ginger ale.

## 2018-10-30 NOTE — ED Notes (Signed)
Pt verbalized understanding of discharge instructions and follow up. IV removed and bleeding controlled. Pt ambulatory to lobby with steady gait.

## 2018-10-30 NOTE — ED Notes (Signed)
Pt reports nausea after drinking ginger ale; denies vomiting

## 2018-10-30 NOTE — ED Notes (Signed)
Pt ambulated to restroom with steady gait. Pt became nauseous and began vomiting.

## 2018-10-30 NOTE — ED Notes (Signed)
Pt denies nausea at discharge

## 2018-10-30 NOTE — ED Notes (Signed)
Pt given K+ PO. Pt able to swallow tolerating well. Within 5 mins pt vomited. Pt unable to tolerate keeping fluids and medicine down at this time.

## 2018-10-30 NOTE — ED Notes (Signed)
Pt had episode of vomiting after meds given. Pt walking to restroom now.

## 2018-10-30 NOTE — ED Provider Notes (Signed)
Care assumed from previous provider PA [facility]. Please see note for further details. Case discussed, plan agreed upon.  Briefly, patient is a 23 year old female here for the third day in a row for cyclical vomiting.  She has done quite well in the emergency department today after symptom control.  She is a little hesitant to go home even though she is now tolerating small sips of fluids.  Understandably as this is her third visit for same.  We will continue to observe her in the emergency department, ensure she is able to have a little food and continue tolerating fluids.  If she feels comfortable, likely will discharged home.    5:03 PM -patient evaluated.  She has not had any further emesis since shift change.  She feels much better now and comfortable with discharge to home.  She has enough medications for symptom control at home.  Discussed reasons to return to the emergency department.  All questions answered.   Ward, Ozella Almond, PA-C 10/30/18 1704    Lajean Saver, MD 10/30/18 1725

## 2018-12-06 ENCOUNTER — Encounter (HOSPITAL_COMMUNITY): Payer: Self-pay | Admitting: Emergency Medicine

## 2018-12-06 ENCOUNTER — Other Ambulatory Visit: Payer: Self-pay

## 2018-12-06 ENCOUNTER — Emergency Department (HOSPITAL_COMMUNITY)
Admission: EM | Admit: 2018-12-06 | Discharge: 2018-12-06 | Disposition: A | Discharge status: Home / Self Care | Payer: BC Managed Care – PPO | Attending: Emergency Medicine | Admitting: Emergency Medicine

## 2018-12-06 DIAGNOSIS — Z87891 Personal history of nicotine dependence: Secondary | ICD-10-CM | POA: Diagnosis not present

## 2018-12-06 DIAGNOSIS — R112 Nausea with vomiting, unspecified: Secondary | ICD-10-CM | POA: Diagnosis present

## 2018-12-06 DIAGNOSIS — Z79899 Other long term (current) drug therapy: Secondary | ICD-10-CM | POA: Diagnosis not present

## 2018-12-06 DIAGNOSIS — R1115 Cyclical vomiting syndrome unrelated to migraine: Secondary | ICD-10-CM | POA: Diagnosis not present

## 2018-12-06 LAB — COMPREHENSIVE METABOLIC PANEL
ALT: 20 U/L (ref 0–44)
AST: 22 U/L (ref 15–41)
Albumin: 4.6 g/dL (ref 3.5–5.0)
Alkaline Phosphatase: 63 U/L (ref 38–126)
Anion gap: 13 (ref 5–15)
BUN: 11 mg/dL (ref 6–20)
CO2: 19 mmol/L — ABNORMAL LOW (ref 22–32)
Calcium: 9.6 mg/dL (ref 8.9–10.3)
Chloride: 107 mmol/L (ref 98–111)
Creatinine, Ser: 0.98 mg/dL (ref 0.44–1.00)
GFR calc Af Amer: >60 mL/min (ref >60)
GFR calc non Af Amer: >60 mL/min (ref >60)
Glucose, Bld: 157 mg/dL — ABNORMAL HIGH (ref 70–99)
Potassium: 2.7 mmol/L — CL (ref 3.5–5.1)
Sodium: 139 mmol/L (ref 135–145)
Total Bilirubin: 0.7 mg/dL (ref 0.3–1.2)
Total Protein: 8.2 g/dL — ABNORMAL HIGH (ref 6.5–8.1)

## 2018-12-06 LAB — CBC WITH DIFFERENTIAL/PLATELET
Abs Immature Granulocytes: 0.05 10*3/uL (ref 0.00–0.07)
Basophils Absolute: 0.1 10*3/uL (ref 0.0–0.1)
Basophils Relative: 1 %
Eosinophils Absolute: 0.2 10*3/uL (ref 0.0–0.5)
Eosinophils Relative: 2 %
HCT: 44.6 % (ref 36.0–46.0)
Hemoglobin: 14.7 g/dL (ref 12.0–15.0)
Immature Granulocytes: 1 %
Lymphocytes Relative: 31 %
Lymphs Abs: 3.3 10*3/uL (ref 0.7–4.0)
MCH: 31.8 pg (ref 26.0–34.0)
MCHC: 33 g/dL (ref 30.0–36.0)
MCV: 96.5 fL (ref 80.0–100.0)
Monocytes Absolute: 0.9 10*3/uL (ref 0.1–1.0)
Monocytes Relative: 8 %
Neutro Abs: 6.2 10*3/uL (ref 1.7–7.7)
Neutrophils Relative %: 57 %
Platelets: 234 10*3/uL (ref 150–400)
RBC: 4.62 MIL/uL (ref 3.87–5.11)
RDW: 12.6 % (ref 11.5–15.5)
WBC: 10.6 10*3/uL — ABNORMAL HIGH (ref 4.0–10.5)
nRBC: 0 % (ref 0.0–0.2)

## 2018-12-06 LAB — I-STAT BETA HCG BLOOD, ED (MC, WL, AP ONLY): I-stat hCG, quantitative: <5 m[IU]/mL (ref <5)

## 2018-12-06 MED ORDER — POTASSIUM CHLORIDE 10 MEQ/100ML IV SOLN
10.0000 meq | Freq: Once | INTRAVENOUS | Status: AC
  Administered 2018-12-06: 10 meq via INTRAVENOUS
  Filled 2018-12-06: qty 100

## 2018-12-06 MED ORDER — ONDANSETRON HCL 4 MG/2ML IJ SOLN
4.0000 mg | Freq: Once | INTRAMUSCULAR | Status: AC
  Administered 2018-12-06: 4 mg via INTRAVENOUS
  Filled 2018-12-06: qty 2

## 2018-12-06 MED ORDER — METOCLOPRAMIDE HCL 5 MG/ML IJ SOLN
10.0000 mg | Freq: Once | INTRAMUSCULAR | Status: AC
  Administered 2018-12-06: 10 mg via INTRAVENOUS
  Filled 2018-12-06: qty 2

## 2018-12-06 MED ORDER — SODIUM CHLORIDE 0.9 % IV BOLUS
1000.0000 mL | Freq: Once | INTRAVENOUS | Status: AC
  Administered 2018-12-06: 1000 mL via INTRAVENOUS

## 2018-12-06 NOTE — Discharge Instructions (Addendum)
Continue medications as previously prescribed.  Clear liquid diet for the next 12 hours, then slowly advance as tolerated.  Return to the emergency department if your symptoms significantly worsen or change.

## 2018-12-06 NOTE — ED Triage Notes (Signed)
Patient reports persistent nausea and emesis today , denies abdominal pain, no fever or chills , pt. stated history of cyclic vomiting syndrome .

## 2018-12-06 NOTE — ED Provider Notes (Signed)
[place] EMERGENCY DEPARTMENT Provider Note   CSN: [id number] Arrival date & time: 12/06/18  0160     History   Chief Complaint Chief Complaint  Patient presents with  . Emesis    HPI KYARA BOXER is a 23 y.o. female.     Patient is a 23 year old female with history of cyclic vomiting syndrome.  She presents today for evaluation of nausea and vomiting.  This started yesterday and is worsening.  Patient denies abdominal pain, diarrhea, or bloody stool.  She denies any bloody vomit.  She denies any fevers.  Symptoms are consistent with prior episodes of cyclic vomiting.  The history is provided by the patient.  Emesis Severity:  Severe Duration:  24 hours Timing:  Constant Quality:  Stomach contents Progression:  Worsening Chronicity:  Recurrent Relieved by:  Nothing Worsened by:  Nothing Ineffective treatments:  None tried   Past Medical History:  Diagnosis Date  . Colitis   . Esophageal yeast infection ([place])   . Pancreatitis     Patient Active Problem List   Diagnosis Date Noted  . Hypokalemia 03/10/2016  . Acute kidney injury ([place]) 03/10/2016  . Nausea and vomiting 03/08/2016    Past Surgical History:  Procedure Laterality Date  . NO PAST SURGERIES       OB History   No obstetric history on file.      Home Medications    Prior to Admission medications   Medication Sig Start Date End Date Taking? Authorizing Provider  metoCLOPramide (REGLAN) 10 MG tablet Take 1 tablet (10 mg total) by mouth every 8 (eight) hours as needed for nausea. 10/30/18   Ward, Delice Bison, DO  potassium chloride SA (K-DUR) 20 MEQ tablet Take 1 tablet (20 mEq total) by mouth 2 (two) times daily. 10/30/18   Ward, Delice Bison, DO  pantoprazole (PROTONIX) 20 MG tablet Take 1 tablet (20 mg total) by mouth daily. Patient not taking: Reported on 10/30/2018 02/22/18 10/30/18  Albesa Seen, PA-C  promethazine (PHENERGAN) 25 MG suppository Place 1 suppository (25 mg  total) rectally every 6 (six) hours as needed for nausea or vomiting. Patient not taking: Reported on 10/30/2018 10/28/18 10/30/18  Varney Biles, MD  sucralfate (CARAFATE) 1 g tablet Take 1 tablet (1 g total) by mouth 4 (four) times daily -  with meals and at bedtime for 7 days. Patient not taking: Reported on 10/30/2018 02/22/18 10/30/18  Albesa Seen, PA-C    Family History Family History  Problem Relation Age of Onset  . Allergic rhinitis Father   . Asthma Father   . Urticaria Father   . Asthma Paternal Aunt   . Eczema Paternal Uncle   . Asthma Paternal Grandfather     Social History Social History   Tobacco Use  . Smoking status: Former Smoker    Types: Cigarettes  . Smokeless tobacco: Never Used  Substance Use Topics  . Alcohol use: No  . Drug use: Yes    Types: Marijuana     Allergies   Patient has no known allergies.   Review of Systems Review of Systems  Gastrointestinal: Positive for vomiting.  All other systems reviewed and are negative.    Physical Exam Updated Vital Signs BP (!) 135/91 (BP Location: Right Arm)   Pulse 81   Temp 98.7 F (37.1 C) (Oral)   Resp 18   SpO2 100%   Physical Exam Vitals signs and nursing note reviewed.  Constitutional:  General: She is not in acute distress.    Appearance: She is well-developed. She is not diaphoretic.  HENT:     Head: Normocephalic and atraumatic.  Neck:     Musculoskeletal: Normal range of motion and neck supple.  Cardiovascular:     Rate and Rhythm: Normal rate and regular rhythm.     Heart sounds: No murmur. No friction rub. No gallop.   Pulmonary:     Effort: Pulmonary effort is normal. No respiratory distress.     Breath sounds: Normal breath sounds. No wheezing.  Abdominal:     General: Bowel sounds are normal. There is no distension.     Palpations: Abdomen is soft.     Tenderness: There is no abdominal tenderness.  Musculoskeletal: Normal range of motion.  Skin:    General:  Skin is warm and dry.  Neurological:     Mental Status: She is alert and oriented to person, place, and time.      ED Treatments / Results  Labs (all labs ordered are listed, but only abnormal results are displayed) Labs Reviewed  CBC WITH DIFFERENTIAL/PLATELET - Abnormal; Notable for the following components:      Result Value   WBC 10.6 (*)    All other components within normal limits  COMPREHENSIVE METABOLIC PANEL - Abnormal; Notable for the following components:   Potassium 2.7 (*)    CO2 19 (*)    Glucose, Bld 157 (*)    Total Protein 8.2 (*)    All other components within normal limits  I-STAT BETA HCG BLOOD, ED ([facility], [facility], [facility] ONLY)    EKG None  Radiology No results found.  Procedures Procedures (including critical care time)  Medications Ordered in ED Medications  sodium chloride 0.9 % bolus 1,000 mL (has no administration in time range)  ondansetron (ZOFRAN) injection 4 mg (has no administration in time range)  metoCLOPramide (REGLAN) injection 10 mg (has no administration in time range)  potassium chloride 10 mEq in 100 mL IVPB (has no administration in time range)     Initial Impression / Assessment and Plan / ED Course  I have reviewed the triage vital signs and the nursing notes.  Pertinent labs & imaging results that were available during my care of the patient were reviewed by me and considered in my medical decision making (see chart for details).  Patient with history of cyclic vomiting presenting with complaints of nausea and vomiting.  Laboratory studies show an essentially normal CBC and electrolytes with mild hypokalemia.  Laboratory studies otherwise unremarkable.  Patient given IV fluids, IV potassium, along with antiemetics and is feeling improved.  She is now tolerating liquids.  I believe she is appropriate for discharge.  Final Clinical Impressions(s) / ED Diagnoses   Final diagnoses:  None    ED Discharge Orders    None       Geoffery Lyonselo,  Taner Rzepka, MD 12/06/18 [phone]

## 2019-03-05 ENCOUNTER — Other Ambulatory Visit: Payer: Self-pay

## 2019-03-05 ENCOUNTER — Encounter (HOSPITAL_COMMUNITY): Payer: Self-pay | Admitting: Emergency Medicine

## 2019-03-05 ENCOUNTER — Emergency Department (HOSPITAL_COMMUNITY)
Admission: EM | Admit: 2019-03-05 | Discharge: 2019-03-05 | Disposition: A | Discharge status: Home / Self Care | Payer: BC Managed Care – PPO | Attending: Emergency Medicine | Admitting: Emergency Medicine

## 2019-03-05 DIAGNOSIS — R112 Nausea with vomiting, unspecified: Secondary | ICD-10-CM | POA: Diagnosis not present

## 2019-03-05 DIAGNOSIS — Z5321 Procedure and treatment not carried out due to patient leaving prior to being seen by health care provider: Secondary | ICD-10-CM | POA: Diagnosis not present

## 2019-03-05 DIAGNOSIS — E876 Hypokalemia: Secondary | ICD-10-CM | POA: Diagnosis not present

## 2019-03-05 DIAGNOSIS — R1110 Vomiting, unspecified: Secondary | ICD-10-CM | POA: Diagnosis not present

## 2019-03-05 DIAGNOSIS — R000 Tachycardia, unspecified: Secondary | ICD-10-CM | POA: Diagnosis not present

## 2019-03-05 DIAGNOSIS — Z3202 Encounter for pregnancy test, result negative: Secondary | ICD-10-CM | POA: Diagnosis not present

## 2019-03-05 LAB — LIPASE, BLOOD: Lipase: 23 U/L (ref 11–51)

## 2019-03-05 LAB — CBC
HCT: 46.3 % — ABNORMAL HIGH (ref 36.0–46.0)
Hemoglobin: 16.2 g/dL — ABNORMAL HIGH (ref 12.0–15.0)
MCH: 31.8 pg (ref 26.0–34.0)
MCHC: 35 g/dL (ref 30.0–36.0)
MCV: 90.8 fL (ref 80.0–100.0)
Platelets: 331 10*3/uL (ref 150–400)
RBC: 5.1 MIL/uL (ref 3.87–5.11)
RDW: 12.5 % (ref 11.5–15.5)
WBC: 16.1 10*3/uL — ABNORMAL HIGH (ref 4.0–10.5)
nRBC: 0 % (ref 0.0–0.2)

## 2019-03-05 LAB — COMPREHENSIVE METABOLIC PANEL
ALT: 24 U/L (ref 0–44)
AST: 21 U/L (ref 15–41)
Albumin: 4.6 g/dL (ref 3.5–5.0)
Alkaline Phosphatase: 72 U/L (ref 38–126)
Anion gap: 18 — ABNORMAL HIGH (ref 5–15)
BUN: 11 mg/dL (ref 6–20)
CO2: 20 mmol/L — ABNORMAL LOW (ref 22–32)
Calcium: 9.7 mg/dL (ref 8.9–10.3)
Chloride: 100 mmol/L (ref 98–111)
Creatinine, Ser: 1.06 mg/dL — ABNORMAL HIGH (ref 0.44–1.00)
GFR calc Af Amer: >60 mL/min (ref >60)
GFR calc non Af Amer: >60 mL/min (ref >60)
Glucose, Bld: 120 mg/dL — ABNORMAL HIGH (ref 70–99)
Potassium: 2.4 mmol/L — CL (ref 3.5–5.1)
Sodium: 138 mmol/L (ref 135–145)
Total Bilirubin: 1 mg/dL (ref 0.3–1.2)
Total Protein: 8.1 g/dL (ref 6.5–8.1)

## 2019-03-05 LAB — I-STAT BETA HCG BLOOD, ED (MC, WL, AP ONLY): I-stat hCG, quantitative: <5 m[IU]/mL (ref <5)

## 2019-03-05 MED ORDER — SODIUM CHLORIDE 0.9% FLUSH: 3.0000 mL | Freq: Once | INTRAVENOUS | Status: DC

## 2019-03-05 NOTE — ED Notes (Signed)
No answer for vital recheck 

## 2019-03-05 NOTE — ED Notes (Signed)
No answer for room 

## 2019-03-05 NOTE — ED Triage Notes (Signed)
Patient reports persistent emesis for 2 days unrelieved by prescription Reglan , no diarrhea or abdominal pain ., denies fever or chills .

## 2019-03-06 DIAGNOSIS — Z3202 Encounter for pregnancy test, result negative: Secondary | ICD-10-CM | POA: Diagnosis not present

## 2019-03-06 DIAGNOSIS — R509 Fever, unspecified: Secondary | ICD-10-CM | POA: Diagnosis not present

## 2019-03-06 DIAGNOSIS — E876 Hypokalemia: Secondary | ICD-10-CM | POA: Diagnosis not present

## 2019-03-06 DIAGNOSIS — R112 Nausea with vomiting, unspecified: Secondary | ICD-10-CM | POA: Diagnosis not present

## 2019-06-15 DIAGNOSIS — L249 Irritant contact dermatitis, unspecified cause: Secondary | ICD-10-CM | POA: Diagnosis not present

## 2019-06-23 ENCOUNTER — Encounter (HOSPITAL_COMMUNITY): Payer: Self-pay | Admitting: Student

## 2019-06-23 ENCOUNTER — Other Ambulatory Visit: Payer: Self-pay

## 2019-06-23 ENCOUNTER — Emergency Department (HOSPITAL_COMMUNITY)
Admission: EM | Admit: 2019-06-23 | Discharge: 2019-06-23 | Disposition: A | Discharge status: Home / Self Care | Payer: BC Managed Care – PPO | Attending: Emergency Medicine | Admitting: Emergency Medicine

## 2019-06-23 DIAGNOSIS — R112 Nausea with vomiting, unspecified: Secondary | ICD-10-CM | POA: Diagnosis not present

## 2019-06-23 DIAGNOSIS — Z87891 Personal history of nicotine dependence: Secondary | ICD-10-CM | POA: Insufficient documentation

## 2019-06-23 DIAGNOSIS — R1084 Generalized abdominal pain: Secondary | ICD-10-CM | POA: Diagnosis not present

## 2019-06-23 LAB — COMPREHENSIVE METABOLIC PANEL
ALT: 23 U/L (ref 0–44)
AST: 35 U/L (ref 15–41)
Albumin: 5.1 g/dL — ABNORMAL HIGH (ref 3.5–5.0)
Alkaline Phosphatase: 76 U/L (ref 38–126)
Anion gap: 14 (ref 5–15)
BUN: 10 mg/dL (ref 6–20)
CO2: 21 mmol/L — ABNORMAL LOW (ref 22–32)
Calcium: 10.2 mg/dL (ref 8.9–10.3)
Chloride: 108 mmol/L (ref 98–111)
Creatinine, Ser: 1.14 mg/dL — ABNORMAL HIGH (ref 0.44–1.00)
GFR calc Af Amer: >60 mL/min (ref >60)
GFR calc non Af Amer: >60 mL/min (ref >60)
Glucose, Bld: 115 mg/dL — ABNORMAL HIGH (ref 70–99)
Potassium: 3.1 mmol/L — ABNORMAL LOW (ref 3.5–5.1)
Sodium: 143 mmol/L (ref 135–145)
Total Bilirubin: 1.6 mg/dL — ABNORMAL HIGH (ref 0.3–1.2)
Total Protein: 9 g/dL — ABNORMAL HIGH (ref 6.5–8.1)

## 2019-06-23 LAB — CBC WITH DIFFERENTIAL/PLATELET
Abs Immature Granulocytes: 0.05 10*3/uL (ref 0.00–0.07)
Basophils Absolute: 0.1 10*3/uL (ref 0.0–0.1)
Basophils Relative: 0 %
Eosinophils Absolute: 0 10*3/uL (ref 0.0–0.5)
Eosinophils Relative: 0 %
HCT: 46.5 % — ABNORMAL HIGH (ref 36.0–46.0)
Hemoglobin: 15.7 g/dL — ABNORMAL HIGH (ref 12.0–15.0)
Immature Granulocytes: 0 %
Lymphocytes Relative: 10 %
Lymphs Abs: 1.5 10*3/uL (ref 0.7–4.0)
MCH: 32.2 pg (ref 26.0–34.0)
MCHC: 33.8 g/dL (ref 30.0–36.0)
MCV: 95.3 fL (ref 80.0–100.0)
Monocytes Absolute: 1.4 10*3/uL — ABNORMAL HIGH (ref 0.1–1.0)
Monocytes Relative: 9 %
Neutro Abs: 12.9 10*3/uL — ABNORMAL HIGH (ref 1.7–7.7)
Neutrophils Relative %: 81 %
Platelets: 292 10*3/uL (ref 150–400)
RBC: 4.88 MIL/uL (ref 3.87–5.11)
RDW: 13.2 % (ref 11.5–15.5)
WBC: 15.9 10*3/uL — ABNORMAL HIGH (ref 4.0–10.5)
nRBC: 0 % (ref 0.0–0.2)

## 2019-06-23 LAB — URINALYSIS, ROUTINE W REFLEX MICROSCOPIC
Bilirubin Urine: NEGATIVE
Glucose, UA: 50 mg/dL — AB
Hgb urine dipstick: NEGATIVE
Ketones, ur: 5 mg/dL — AB
Nitrite: NEGATIVE
Protein, ur: 100 mg/dL — AB
Specific Gravity, Urine: 1.034 — ABNORMAL HIGH (ref 1.005–1.030)
pH: 6 (ref 5.0–8.0)

## 2019-06-23 LAB — I-STAT BETA HCG BLOOD, ED (MC, WL, AP ONLY): I-stat hCG, quantitative: <5 m[IU]/mL (ref <5)

## 2019-06-23 LAB — LIPASE, BLOOD: Lipase: 27 U/L (ref 11–51)

## 2019-06-23 MED ORDER — ONDANSETRON HCL 4 MG/2ML IJ SOLN
4.0000 mg | Freq: Once | INTRAMUSCULAR | Status: AC
  Administered 2019-06-23: 4 mg via INTRAVENOUS
  Filled 2019-06-23: qty 2

## 2019-06-23 MED ORDER — THIAMINE HCL 100 MG PO TABS: 100.0000 mg | ORAL_TABLET | Freq: Every day | ORAL | Status: DC

## 2019-06-23 MED ORDER — SODIUM CHLORIDE 0.9 % IV BOLUS
1000.0000 mL | Freq: Once | INTRAVENOUS | Status: AC
  Administered 2019-06-23: 1000 mL via INTRAVENOUS

## 2019-06-23 MED ORDER — CHLORDIAZEPOXIDE HCL 25 MG PO CAPS: ORAL_CAPSULE | ORAL | 0 refills | Status: AC

## 2019-06-23 MED ORDER — THIAMINE HCL 100 MG/ML IJ SOLN
100.0000 mg | Freq: Every day | INTRAMUSCULAR | Status: DC
  Administered 2019-06-23: 100 mg via INTRAVENOUS
  Filled 2019-06-23: qty 2

## 2019-06-23 MED ORDER — LORAZEPAM 2 MG/ML IJ SOLN
0.0000 mg | Freq: Four times a day (QID) | INTRAMUSCULAR | Status: DC
  Administered 2019-06-23: 09:00:00 2 mg via INTRAVENOUS
  Filled 2019-06-23: qty 2

## 2019-06-23 MED ORDER — POTASSIUM CHLORIDE CRYS ER 20 MEQ PO TBCR
40.0000 meq | EXTENDED_RELEASE_TABLET | Freq: Once | ORAL | Status: AC
  Administered 2019-06-23: 40 meq via ORAL
  Filled 2019-06-23: qty 2

## 2019-06-23 MED ORDER — POTASSIUM CHLORIDE CRYS ER 20 MEQ PO TBCR: 20.0000 meq | EXTENDED_RELEASE_TABLET | Freq: Every day | ORAL | 0 refills | Status: DC

## 2019-06-23 MED ORDER — LORAZEPAM 2 MG/ML IJ SOLN: 0.0000 mg | Freq: Two times a day (BID) | INTRAMUSCULAR | Status: DC

## 2019-06-23 MED ORDER — LORAZEPAM 1 MG PO TABS: 0.0000 mg | ORAL_TABLET | Freq: Two times a day (BID) | ORAL | Status: DC

## 2019-06-23 MED ORDER — ONDANSETRON 4 MG PO TBDP: 4.0000 mg | ORAL_TABLET | Freq: Three times a day (TID) | ORAL | 0 refills | Status: DC | PRN

## 2019-06-23 MED ORDER — LORAZEPAM 1 MG PO TABS: 0.0000 mg | ORAL_TABLET | Freq: Four times a day (QID) | ORAL | Status: DC

## 2019-06-23 NOTE — ED Triage Notes (Signed)
Pt presents with N/V since waking up 0600 Wednesday am. Pt reports she has vomited approx 15 times, now just nausea, reports drinking an unknown amount of ETOH Tuesday night along with eating chicken from [employer] fast food restaurant.  Pt reports drinking 1/5 [employer] [place] [employer] daily. Last drank 2 days ago

## 2019-06-23 NOTE — Discharge Instructions (Addendum)
You were seen in the ER today for nausea and vomiting.  Your labs were overall reassuring, however you do have a low potassium, we are sending you home with potassium tablets to help with this, you also appears somewhat dehydrated with abnormal kidney function, protein, and urine, please have these rechecked by your primary care provider within 1 week.  We are sending you home with a Librium taper to help with nausea and vomiting and to help avoid alcohol withdrawal.  Do not drink alcohol you take this medicine.  Do not drive or operate heavy machinery with this medicine as it can make you sleepy.  Medicine is a controlled substance with potential for addiction.  We  are also send you home with Zofran to take every 8 hours as needed for nausea and vomiting.  We have prescribed you new medication(s) today. Discuss the medications prescribed today with your pharmacist as they can have adverse effects and interactions with your other medicines including over the counter and prescribed medications. Seek medical evaluation if you start to experience new or abnormal symptoms after taking one of these medicines, seek care immediately if you start to experience difficulty breathing, feeling of your throat closing, facial swelling, or rash as these could be indications of a more serious allergic reaction  Please follow-up with your primary care provider within 1 to 3 days for reevaluation.  Return to the emergency department for new or worsening symptoms including but not limited to inability to keep fluids down, fever, abdominal pain, blood in your stool, blood in your vomit, seizure activity, hallucinations, tremors, being sweaty/achy, or any other concerns.

## 2019-06-23 NOTE — ED Notes (Signed)
Pt reports that she does not feel well and would like to speak to the provider again prior to leaving.

## 2019-06-23 NOTE — ED Provider Notes (Signed)
[facility] EMERGENCY DEPARTMENT Provider Note   CSN: [id number] Arrival date & time: 06/23/19  8921     History Chief Complaint  Patient presents with   Emesis    Debra Thornton is a 24 y.o. female with a history of pancreatitis, colitis, & cyclic vomiting syndrome who presents to the ED with complaints of nausea & vomiting over the past 2 days. Patient states she has had about 15 episodes of emesis over the past 48 hours, she feels persistently nauseated, no alleviating/aggravating factors. No intervention PTA. Denies fever, chills, hematemesis, abdominal pain, melena, hematochezia, diarrhea, dysuria, or vaginal bleeding/discharge. She reports occasional marijuana use, most recently used 2 days prior.  She reports alcohol use every other day, states she has about a half of a fifth of liquor. Last drank yesterday. She denies hx of EtOH withdrawal or withdrawal seizures.  Denies seizure activity, hallucinations, diaphoresis.  States this feels similar to her prior issues with cyclic vomiting syndrome.  HPI     Past Medical History:  Diagnosis Date   Colitis    Esophageal yeast infection (HCC)    Pancreatitis     Patient Active Problem List   Diagnosis Date Noted   Hypokalemia 03/10/2016   Acute kidney injury (HCC) 03/10/2016   Nausea and vomiting 03/08/2016    Past Surgical History:  Procedure Laterality Date   NO PAST SURGERIES       OB History   No obstetric history on file.     Family History  Problem Relation Age of Onset   Allergic rhinitis Father    Asthma Father    Urticaria Father    Asthma Paternal Aunt    Eczema Paternal Uncle    Asthma Paternal Grandfather     Social History   Tobacco Use   Smoking status: Former Smoker    Types: Cigarettes   Smokeless tobacco: Never Used  Substance Use Topics   Alcohol use: No   Drug use: Yes    Types: Marijuana    Home Medications Prior to Admission medications     Medication Sig Start Date End Date Taking? Authorizing Provider  metoCLOPramide (REGLAN) 10 MG tablet Take 1 tablet (10 mg total) by mouth every 8 (eight) hours as needed for nausea. Patient not taking: Reported on 12/06/2018 10/30/18   Ward, Layla Maw, DO  potassium chloride SA (K-DUR) 20 MEQ tablet Take 1 tablet (20 mEq total) by mouth 2 (two) times daily. Patient not taking: Reported on 12/06/2018 10/30/18   Ward, Layla Maw, DO  pantoprazole (PROTONIX) 20 MG tablet Take 1 tablet (20 mg total) by mouth daily. Patient not taking: Reported on 10/30/2018 02/22/18 10/30/18  Elisha Ponder, PA-C  promethazine (PHENERGAN) 25 MG suppository Place 1 suppository (25 mg total) rectally every 6 (six) hours as needed for nausea or vomiting. Patient not taking: Reported on 10/30/2018 10/28/18 10/30/18  Derwood Kaplan, MD  sucralfate (CARAFATE) 1 g tablet Take 1 tablet (1 g total) by mouth 4 (four) times daily -  with meals and at bedtime for 7 days. Patient not taking: Reported on 10/30/2018 02/22/18 10/30/18  Elisha Ponder, PA-C    Allergies    Patient has no known allergies.  Review of Systems   Review of Systems  Constitutional: Negative for chills, diaphoresis and fever.  Respiratory: Negative for shortness of breath.   Cardiovascular: Negative for chest pain.  Gastrointestinal: Positive for nausea and vomiting. Negative for abdominal pain, anal bleeding, blood in stool,  constipation and diarrhea.  Genitourinary: Negative for dysuria, vaginal bleeding and vaginal discharge.  Neurological: Negative for seizures and syncope.  Psychiatric/Behavioral: Negative for hallucinations.  All other systems reviewed and are negative.   Physical Exam Updated Vital Signs BP 135/84    Pulse 97    Temp 99.2 F (37.3 C) (Oral)    Resp 16    Ht 5\' 4"  (1.626 m)    Wt 90.7 kg    SpO2 100%    BMI 34.33 kg/m   Physical Exam Vitals and nursing note reviewed.  Constitutional:      General: She is not in acute  distress.    Appearance: She is well-developed. She is not toxic-appearing.  HENT:     Head: Normocephalic and atraumatic.  Eyes:     General:        Right eye: No discharge.        Left eye: No discharge.     Conjunctiva/sclera: Conjunctivae normal.  Cardiovascular:     Rate and Rhythm: Normal rate and regular rhythm.  Pulmonary:     Effort: Pulmonary effort is normal. No respiratory distress.     Breath sounds: Normal breath sounds. No wheezing, rhonchi or rales.  Abdominal:     General: There is no distension.     Palpations: Abdomen is soft.     Tenderness: There is generalized abdominal tenderness. There is no rebound. Negative signs include Murphy's sign and McBurney's sign.  Musculoskeletal:     Cervical back: Neck supple.  Skin:    General: Skin is warm and dry.     Findings: No rash.  Neurological:     Mental Status: She is alert.     Motor: No tremor or seizure activity.     Comments: Clear speech.   Psychiatric:        Behavior: Behavior normal. Behavior is not aggressive.    ED Results / Procedures / Treatments   Labs (all labs ordered are listed, but only abnormal results are displayed) Labs Reviewed  COMPREHENSIVE METABOLIC PANEL - Abnormal; Notable for the following components:      Result Value   Potassium 3.1 (*)    CO2 21 (*)    Glucose, Bld 115 (*)    Creatinine, Ser 1.14 (*)    Total Protein 9.0 (*)    Albumin 5.1 (*)    Total Bilirubin 1.6 (*)    All other components within normal limits  CBC WITH DIFFERENTIAL/PLATELET - Abnormal; Notable for the following components:   WBC 15.9 (*)    Hemoglobin 15.7 (*)    HCT 46.5 (*)    Neutro Abs 12.9 (*)    Monocytes Absolute 1.4 (*)    All other components within normal limits  URINALYSIS, ROUTINE W REFLEX MICROSCOPIC - Abnormal; Notable for the following components:   Color, Urine AMBER (*)    APPearance CLOUDY (*)    Specific Gravity, Urine 1.034 (*)    Glucose, UA 50 (*)    Ketones, ur 5 (*)     Protein, ur 100 (*)    Leukocytes,Ua TRACE (*)    Bacteria, UA FEW (*)    All other components within normal limits  LIPASE, BLOOD  I-STAT BETA HCG BLOOD, ED ([facility], [facility], [facility] ONLY)    EKG None  Radiology No results found.  Procedures Procedures (including critical care time)  Medications Ordered in ED Medications  LORazepam (ATIVAN) injection 0-4 mg (2 mg Intravenous Given 06/23/19 0917)    Or  LORazepam (ATIVAN) tablet 0-4 mg ( Oral See Alternative 06/23/19 0917)  LORazepam (ATIVAN) injection 0-4 mg (has no administration in time range)    Or  LORazepam (ATIVAN) tablet 0-4 mg (has no administration in time range)  thiamine tablet 100 mg ( Oral See Alternative 06/23/19 0920)    Or  thiamine (B-1) injection 100 mg (100 mg Intravenous Given 06/23/19 0920)  ondansetron (ZOFRAN) injection 4 mg (has no administration in time range)  sodium chloride 0.9 % bolus 1,000 mL (1,000 mLs Intravenous New Bag/Given 06/23/19 0917)  potassium chloride SA (KLOR-CON) CR tablet 40 mEq (40 mEq Oral Given 06/23/19 1033)     09/07/2018: CT abdomen/pelvis with contrast: No CT evidence of acute abnormality within the abdomen or pelvis.  ED Course  I have reviewed the triage vital signs and the nursing notes.  Pertinent labs & imaging results that were available during my care of the patient were reviewed by me and considered in my medical decision making (see chart for details).    MDM Rules/Calculators/A&P                      Patient presents to the ED with complaints of N/V x 2 days. Nontoxic, initial vitals on my assessment WNL. Generalized abdominal tenderness without focality or peritoneal signs. Initially RN expressed concern for EtOH withdrawal, placed on CIWA protocol, initially she felt CIWA was 27 however on my assessment would put this much lower, asked for 2 mg of Ativan to be given as opposed to 4 mg. Fluids ordered as well.   CBC: Leukocytosis but has similar with prior labs on review.  Hgb/hct elevated likely hemoconcentration. CMP: Hypokalemia- oral replacement in the ED. Mildly elevated creatinine & protein- likely dehydration, receiving fluids. Mild elevated t bili. LFTs WNL.  Lipase: WNL Preg test: negative UA; Consistent w/ dehydration.   09:50: Repeat CIWA per RN 4 10:40: RE-EVAL: Patient feels much better, she is requesting something to eat/drink. Repeat abdominal exam is non tender w/o peritoneal signs.   Serial abdominal exams in the ED remain without tenderness/peritoneal signs do not suspect acute surgical process.  She has had prior CT scans with similar presentations, most recently in May 2020 without acute process.  Doubt cholecystitis, pancreatitis, obstruction, perforation, appendicitis, or diverticulitis.  Pregnancy test is negative therefore do not suspect ectopic.  No GU complaints to raise concern for PID.  Patient feeling improved and tolerating p.o. in the emergency department.  Her repeat CIWA is 4.  She would like to take a Librium taper and stop drinking- educated on this medicine.  Also provide Zofran.  Potassium supplement. I discussed results, treatment plan, need for follow-up, and return precautions with the patient. Provided opportunity for questions, patient confirmed understanding and is in agreement with plan.   Findings and plan of care discussed with supervising physician Dr. Pilar Plate who is in agreement.   Final Clinical Impression(s) / ED Diagnoses Final diagnoses:  Non-intractable vomiting with nausea, unspecified vomiting type    Rx / DC Orders ED Discharge Orders         Ordered    potassium chloride SA (KLOR-CON) 20 MEQ tablet  Daily     06/23/19 1133    chlordiazePOXIDE (LIBRIUM) 25 MG capsule     06/23/19 1133    ondansetron (ZOFRAN ODT) 4 MG disintegrating tablet  Every 8 hours PRN     06/23/19 1133           Shameer Molstad R, PA-C  06/23/19 1137    Maudie Flakes, MD 06/26/19 1558

## 2019-06-23 NOTE — ED Notes (Signed)
Patient verbalizes understanding of discharge instructions. Opportunity for questioning and answers were provided. Armband removed by staff, pt discharged from ED.  

## 2019-08-09 ENCOUNTER — Other Ambulatory Visit: Payer: Self-pay

## 2019-08-09 ENCOUNTER — Emergency Department (HOSPITAL_COMMUNITY)
Admission: EM | Admit: 2019-08-09 | Discharge: 2019-08-09 | Discharge status: Against Medical Advice | Payer: BC Managed Care – PPO

## 2019-08-09 NOTE — ED Notes (Signed)
Patient called for triage x3, no answer.  

## 2019-08-11 ENCOUNTER — Encounter (HOSPITAL_BASED_OUTPATIENT_CLINIC_OR_DEPARTMENT_OTHER): Payer: Self-pay | Admitting: Emergency Medicine

## 2019-08-11 ENCOUNTER — Other Ambulatory Visit: Payer: Self-pay

## 2019-08-11 ENCOUNTER — Emergency Department (HOSPITAL_COMMUNITY)
Admission: EM | Admit: 2019-08-11 | Discharge: 2019-08-11 | Disposition: A | Discharge status: Home / Self Care | Payer: BC Managed Care – PPO | Attending: Emergency Medicine | Admitting: Emergency Medicine

## 2019-08-11 ENCOUNTER — Encounter (HOSPITAL_COMMUNITY): Payer: Self-pay | Admitting: *Deleted

## 2019-08-11 ENCOUNTER — Emergency Department (HOSPITAL_BASED_OUTPATIENT_CLINIC_OR_DEPARTMENT_OTHER)
Admission: EM | Admit: 2019-08-11 | Discharge: 2019-08-12 | Disposition: A | Discharge status: Home / Self Care | Payer: BC Managed Care – PPO | Source: Home / Self Care | Attending: Emergency Medicine | Admitting: Emergency Medicine

## 2019-08-11 DIAGNOSIS — E876 Hypokalemia: Secondary | ICD-10-CM | POA: Diagnosis not present

## 2019-08-11 DIAGNOSIS — R112 Nausea with vomiting, unspecified: Secondary | ICD-10-CM | POA: Diagnosis not present

## 2019-08-11 DIAGNOSIS — F1290 Cannabis use, unspecified, uncomplicated: Secondary | ICD-10-CM | POA: Diagnosis not present

## 2019-08-11 DIAGNOSIS — R1115 Cyclical vomiting syndrome unrelated to migraine: Secondary | ICD-10-CM | POA: Insufficient documentation

## 2019-08-11 DIAGNOSIS — R1110 Vomiting, unspecified: Secondary | ICD-10-CM | POA: Diagnosis not present

## 2019-08-11 DIAGNOSIS — R000 Tachycardia, unspecified: Secondary | ICD-10-CM | POA: Diagnosis not present

## 2019-08-11 LAB — COMPREHENSIVE METABOLIC PANEL
ALT: 26 U/L (ref 0–44)
ALT: 22 U/L (ref 0–44)
AST: 32 U/L (ref 15–41)
AST: 25 U/L (ref 15–41)
Albumin: 5 g/dL (ref 3.5–5.0)
Albumin: 4.7 g/dL (ref 3.5–5.0)
Alkaline Phosphatase: 78 U/L (ref 38–126)
Alkaline Phosphatase: 71 U/L (ref 38–126)
Anion gap: 17 — ABNORMAL HIGH (ref 5–15)
Anion gap: 13 (ref 5–15)
BUN: 10 mg/dL (ref 6–20)
BUN: 11 mg/dL (ref 6–20)
CO2: 22 mmol/L (ref 22–32)
CO2: 24 mmol/L (ref 22–32)
Calcium: 9.8 mg/dL (ref 8.9–10.3)
Calcium: 9.4 mg/dL (ref 8.9–10.3)
Chloride: 101 mmol/L (ref 98–111)
Chloride: 98 mmol/L (ref 98–111)
Creatinine, Ser: 0.99 mg/dL (ref 0.44–1.00)
Creatinine, Ser: 1 mg/dL (ref 0.44–1.00)
GFR calc Af Amer: >60 mL/min (ref >60)
GFR calc Af Amer: >60 mL/min (ref >60)
GFR calc non Af Amer: >60 mL/min (ref >60)
GFR calc non Af Amer: >60 mL/min (ref >60)
Glucose, Bld: 138 mg/dL — ABNORMAL HIGH (ref 70–99)
Glucose, Bld: 117 mg/dL — ABNORMAL HIGH (ref 70–99)
Potassium: 3.4 mmol/L — ABNORMAL LOW (ref 3.5–5.1)
Potassium: 2.7 mmol/L — CL (ref 3.5–5.1)
Sodium: 140 mmol/L (ref 135–145)
Sodium: 135 mmol/L (ref 135–145)
Total Bilirubin: 0.7 mg/dL (ref 0.3–1.2)
Total Bilirubin: 1.5 mg/dL — ABNORMAL HIGH (ref 0.3–1.2)
Total Protein: 8.6 g/dL — ABNORMAL HIGH (ref 6.5–8.1)
Total Protein: 8.2 g/dL — ABNORMAL HIGH (ref 6.5–8.1)

## 2019-08-11 LAB — URINALYSIS, ROUTINE W REFLEX MICROSCOPIC
Bilirubin Urine: NEGATIVE
Glucose, UA: NEGATIVE mg/dL
Glucose, UA: NEGATIVE mg/dL
Hgb urine dipstick: NEGATIVE
Hgb urine dipstick: NEGATIVE
Ketones, ur: 20 mg/dL — AB
Ketones, ur: 15 mg/dL — AB
Leukocytes,Ua: NEGATIVE
Nitrite: NEGATIVE
Nitrite: NEGATIVE
Protein, ur: 100 mg/dL — AB
Protein, ur: 30 mg/dL — AB
Specific Gravity, Urine: 1.038 — ABNORMAL HIGH (ref 1.005–1.030)
Specific Gravity, Urine: 1.01 (ref 1.005–1.030)
pH: 5 (ref 5.0–8.0)
pH: 6.5 (ref 5.0–8.0)

## 2019-08-11 LAB — CBC
HCT: 46.6 % — ABNORMAL HIGH (ref 36.0–46.0)
Hemoglobin: 15.5 g/dL — ABNORMAL HIGH (ref 12.0–15.0)
MCH: 32.6 pg (ref 26.0–34.0)
MCHC: 33.3 g/dL (ref 30.0–36.0)
MCV: 97.9 fL (ref 80.0–100.0)
Platelets: 254 10*3/uL (ref 150–400)
RBC: 4.76 MIL/uL (ref 3.87–5.11)
RDW: 13.1 % (ref 11.5–15.5)
WBC: 13.6 10*3/uL — ABNORMAL HIGH (ref 4.0–10.5)
nRBC: 0 % (ref 0.0–0.2)

## 2019-08-11 LAB — CBC WITH DIFFERENTIAL/PLATELET
Abs Immature Granulocytes: 0.07 10*3/uL (ref 0.00–0.07)
Basophils Absolute: 0 10*3/uL (ref 0.0–0.1)
Basophils Relative: 0 %
Eosinophils Absolute: 0 10*3/uL (ref 0.0–0.5)
Eosinophils Relative: 0 %
HCT: 43 % (ref 36.0–46.0)
Hemoglobin: 15.1 g/dL — ABNORMAL HIGH (ref 12.0–15.0)
Immature Granulocytes: 0 %
Lymphocytes Relative: 7 %
Lymphs Abs: 1.1 10*3/uL (ref 0.7–4.0)
MCH: 33 pg (ref 26.0–34.0)
MCHC: 35.1 g/dL (ref 30.0–36.0)
MCV: 94.1 fL (ref 80.0–100.0)
Monocytes Absolute: 1.3 10*3/uL — ABNORMAL HIGH (ref 0.1–1.0)
Monocytes Relative: 8 %
Neutro Abs: 13.6 10*3/uL — ABNORMAL HIGH (ref 1.7–7.7)
Neutrophils Relative %: 85 %
Platelets: 254 10*3/uL (ref 150–400)
RBC: 4.57 MIL/uL (ref 3.87–5.11)
RDW: 12.8 % (ref 11.5–15.5)
WBC: 16.1 10*3/uL — ABNORMAL HIGH (ref 4.0–10.5)
nRBC: 0 % (ref 0.0–0.2)

## 2019-08-11 LAB — RAPID URINE DRUG SCREEN, HOSP PERFORMED
Amphetamines: NOT DETECTED
Barbiturates: NOT DETECTED
Benzodiazepines: NOT DETECTED
Cocaine: NOT DETECTED
Opiates: NOT DETECTED
Tetrahydrocannabinol: POSITIVE — AB

## 2019-08-11 LAB — I-STAT BETA HCG BLOOD, ED (MC, WL, AP ONLY): I-stat hCG, quantitative: <5 m[IU]/mL (ref <5)

## 2019-08-11 LAB — URINALYSIS, MICROSCOPIC (REFLEX)

## 2019-08-11 LAB — LIPASE, BLOOD
Lipase: 21 U/L (ref 11–51)
Lipase: 24 U/L (ref 11–51)

## 2019-08-11 LAB — PREGNANCY, URINE: Preg Test, Ur: NEGATIVE

## 2019-08-11 MED ORDER — METOCLOPRAMIDE HCL 5 MG/ML IJ SOLN
10.0000 mg | Freq: Once | INTRAMUSCULAR | Status: AC
  Administered 2019-08-11: 10 mg via INTRAVENOUS
  Filled 2019-08-11: qty 2

## 2019-08-11 MED ORDER — LORAZEPAM 2 MG/ML IJ SOLN
1.0000 mg | Freq: Once | INTRAMUSCULAR | Status: AC
  Administered 2019-08-11: 1 mg via INTRAVENOUS
  Filled 2019-08-11: qty 1

## 2019-08-11 MED ORDER — SODIUM CHLORIDE 0.9% FLUSH: 3.0000 mL | Freq: Once | INTRAVENOUS | Status: DC

## 2019-08-11 MED ORDER — METOCLOPRAMIDE HCL 5 MG/ML IJ SOLN
10.0000 mg | Freq: Once | INTRAMUSCULAR | Status: AC
  Administered 2019-08-11: 06:00:00 10 mg via INTRAMUSCULAR
  Filled 2019-08-11: qty 2

## 2019-08-11 MED ORDER — ALUM & MAG HYDROXIDE-SIMETH 200-200-20 MG/5ML PO SUSP
30.0000 mL | Freq: Once | ORAL | Status: AC
  Administered 2019-08-11: 30 mL via ORAL
  Filled 2019-08-11: qty 30

## 2019-08-11 MED ORDER — HYOSCYAMINE SULFATE 0.125 MG SL SUBL
0.2500 mg | SUBLINGUAL_TABLET | Freq: Once | SUBLINGUAL | Status: AC
  Administered 2019-08-11: 0.25 mg via SUBLINGUAL
  Filled 2019-08-11: qty 2

## 2019-08-11 MED ORDER — LIDOCAINE VISCOUS HCL 2 % MT SOLN
15.0000 mL | Freq: Once | OROMUCOSAL | Status: AC
  Administered 2019-08-11: 15 mL via ORAL
  Filled 2019-08-11: qty 15

## 2019-08-11 MED ORDER — ONDANSETRON 4 MG PO TBDP
4.0000 mg | ORAL_TABLET | Freq: Once | ORAL | Status: AC
  Administered 2019-08-11: 06:00:00 4 mg via ORAL
  Filled 2019-08-11: qty 1

## 2019-08-11 MED ORDER — ONDANSETRON 4 MG PO TBDP: 4.0000 mg | ORAL_TABLET | Freq: Three times a day (TID) | ORAL | 0 refills | Status: AC | PRN

## 2019-08-11 MED ORDER — SODIUM CHLORIDE 0.9 % IV BOLUS
1000.0000 mL | Freq: Once | INTRAVENOUS | Status: AC
  Administered 2019-08-11: 1000 mL via INTRAVENOUS

## 2019-08-11 NOTE — ED Triage Notes (Signed)
Pt states she has had nausea and vomiting for 48 hrs  Denies abd pain or diarrhea  Pt states she was seen at [facility] and was given zofran and haldol without improvement

## 2019-08-11 NOTE — ED Provider Notes (Signed)
[facility] EMERGENCY DEPARTMENT Provider Note   CSN: [id number] Arrival date & time: 08/11/19  2100     History Chief Complaint  Patient presents with  . Emesis    Debra Thornton is a 24 y.o. female.  Patient is a 24 year old female with past medical history of colitis, pancreatitis, and hyperemesis possibly related to cannabis use.  Debra Thornton presents today for evaluation of abdominal cramping, nausea, and vomiting.  This has been occurring for the past 2 days.  Debra Thornton was seen at [facility] this morning and given Haldol and Zofran, however this did not seem to help.  Debra Thornton returns here stating that symptoms persist.  Debra Thornton denies any bloody stool or vomit.  Debra Thornton denies any fevers or chills.  The history is provided by the patient.  Emesis Severity:  Moderate Duration:  2 days Timing:  Constant Quality:  Stomach contents Progression:  Unchanged Chronicity:  Recurrent Recent urination:  Normal Context: self-induced   Relieved by:  Nothing Worsened by:  Nothing Ineffective treatments:  None tried Associated symptoms: abdominal pain        Past Medical History:  Diagnosis Date  . Colitis   . Esophageal yeast infection (HCC)   . Pancreatitis     Patient Active Problem List   Diagnosis Date Noted  . Hypokalemia 03/10/2016  . Acute kidney injury (HCC) 03/10/2016  . Nausea and vomiting 03/08/2016    Past Surgical History:  Procedure Laterality Date  . NO PAST SURGERIES       OB History   No obstetric history on file.     Family History  Problem Relation Age of Onset  . Allergic rhinitis Father   . Asthma Father   . Urticaria Father   . Asthma Paternal Aunt   . Eczema Paternal Uncle   . Asthma Paternal Grandfather     Social History   Tobacco Use  . Smoking status: Former Smoker    Types: Cigarettes  . Smokeless tobacco: Never Used  Substance Use Topics  . Alcohol use: No  . Drug use: Yes    Types: Marijuana    Home Medications Prior to Admission  medications   Medication Sig Start Date End Date Taking? Authorizing Provider  chlordiazePOXIDE (LIBRIUM) 25 MG capsule 50mg  PO TID x 1D, then 25-50mg  PO BID X 1D, then 25-50mg  PO QD X 1D 06/23/19   Petrucelli, Samantha R, PA-C  ondansetron (ZOFRAN ODT) 4 MG disintegrating tablet Take 1 tablet (4 mg total) by mouth every 8 (eight) hours as needed for up to 3 days for nausea or vomiting. 08/11/19 08/14/19  10/14/19, MD  potassium chloride SA (KLOR-CON) 20 MEQ tablet Take 1 tablet (20 mEq total) by mouth daily. 06/23/19   Petrucelli, Samantha R, PA-C  triamcinolone ointment (KENALOG) 0.1 % Apply 1 application topically 2 (two) times daily. 06/16/19   [provider]  metoCLOPramide (REGLAN) 10 MG tablet Take 1 tablet (10 mg total) by mouth every 8 (eight) hours as needed for nausea. Patient not taking: Reported on 12/06/2018 10/30/18 06/23/19  Ward, 06/25/19, DO  pantoprazole (PROTONIX) 20 MG tablet Take 1 tablet (20 mg total) by mouth daily. Patient not taking: Reported on 10/30/2018 02/22/18 10/30/18  11/01/18, PA-C  promethazine (PHENERGAN) 25 MG suppository Place 1 suppository (25 mg total) rectally every 6 (six) hours as needed for nausea or vomiting. Patient not taking: Reported on 10/30/2018 10/28/18 10/30/18  11/01/18, MD  sucralfate (CARAFATE) 1 g tablet  Take 1 tablet (1 g total) by mouth 4 (four) times daily -  with meals and at bedtime for 7 days. Patient not taking: Reported on 10/30/2018 02/22/18 10/30/18  Elisha Ponder, PA-C    Allergies    Patient has no known allergies.  Review of Systems   Review of Systems  Gastrointestinal: Positive for abdominal pain and vomiting.  All other systems reviewed and are negative.   Physical Exam Updated Vital Signs BP (!) 140/95 (BP Location: Right Arm)   Pulse (!) 106   Temp 98.6 F (37 C) (Oral)   Resp 18   Ht 5\' 4"  (1.626 m)   Wt 92.5 kg   LMP 07/21/2019   SpO2 100%   BMI 35.02 kg/m   Physical  Exam Vitals and nursing note reviewed.  Constitutional:      General: Debra Thornton is not in acute distress.    Appearance: Debra Thornton is well-developed. Debra Thornton is not diaphoretic.  HENT:     Head: Normocephalic and atraumatic.  Cardiovascular:     Rate and Rhythm: Normal rate and regular rhythm.     Heart sounds: No murmur. No friction rub. No gallop.   Pulmonary:     Effort: Pulmonary effort is normal. No respiratory distress.     Breath sounds: Normal breath sounds. No wheezing.  Abdominal:     General: Bowel sounds are normal. There is no distension.     Palpations: Abdomen is soft.     Tenderness: There is no abdominal tenderness.  Musculoskeletal:        General: Normal range of motion.     Cervical back: Normal range of motion and neck supple.  Skin:    General: Skin is warm and dry.  Neurological:     Mental Status: Debra Thornton is alert and oriented to person, place, and time.     ED Results / Procedures / Treatments   Labs (all labs ordered are listed, but only abnormal results are displayed) Labs Reviewed  COMPREHENSIVE METABOLIC PANEL  LIPASE, BLOOD  CBC WITH DIFFERENTIAL/PLATELET  URINALYSIS, ROUTINE W REFLEX MICROSCOPIC  RAPID URINE DRUG SCREEN, HOSP PERFORMED  PREGNANCY, URINE    EKG None  Radiology No results found.  Procedures Procedures (including critical care time)  Medications Ordered in ED Medications  sodium chloride 0.9 % bolus 1,000 mL (has no administration in time range)  LORazepam (ATIVAN) injection 1 mg (has no administration in time range)  metoCLOPramide (REGLAN) injection 10 mg (has no administration in time range)    ED Course  I have reviewed the triage vital signs and the nursing notes.  Pertinent labs & imaging results that were available during my care of the patient were reviewed by me and considered in my medical decision making (see chart for details).    MDM Rules/Calculators/A&P  Patient with history of hyperemesis, possibly related to  marijuana use presenting with complaints of nausea and vomiting.  Debra Thornton was seen less than 24 hours ago and given Haldol and Zofran with little relief.  Her symptoms returned after Debra Thornton returned home.  Laboratory studies pending at this time.  Patient to receive IV fluids as well as Zofran and Ativan as Debra Thornton tells me that this has happened in the past and this helps.  Care will be signed out to Dr. 07/23/2019 at shift change.  He will reassess the patient, follow-up on the laboratory studies, and determine the final disposition.  Final Clinical Impression(s) / ED Diagnoses Final diagnoses:  None  Rx / DC Orders ED Discharge Orders    None       Veryl Speak, MD 08/11/19 2348

## 2019-08-11 NOTE — ED Triage Notes (Signed)
The pt has had nausea and vomiting for 48 hours  No diarrhea  lmp 3 weeks ago

## 2019-08-11 NOTE — ED Provider Notes (Signed)
[facility] EMERGENCY DEPARTMENT Provider Note  CSN: [id number] Arrival date & time: 08/11/19 0112  Chief Complaint(s) Emesis  HPI Debra Thornton is a 24 y.o. female with a history of cyclical vomiting who presents to the emergency departments for the same.  Patient reports that this is been ongoing for 48 hours.  Endorses smoking marijuana a day prior to onset.  Decreased oral intake.  She denies any fevers or chills.  Abdominal discomfort with emesis.  No known sick contacts.  No chest pain or shortness of breath.  No urinary symptoms.  No diarrhea.  No other physical complaints.  HPI  Past Medical History Past Medical History:  Diagnosis Date  . Colitis   . Esophageal yeast infection (HCC)   . Pancreatitis    Patient Active Problem List   Diagnosis Date Noted  . Hypokalemia 03/10/2016  . Acute kidney injury (HCC) 03/10/2016  . Nausea and vomiting 03/08/2016   Home Medication(s) Prior to Admission medications   Medication Sig Start Date End Date Taking? Authorizing Provider  chlordiazePOXIDE (LIBRIUM) 25 MG capsule 50mg  PO TID x 1D, then 25-50mg  PO BID X 1D, then 25-50mg  PO QD X 1D 06/23/19   Petrucelli, Samantha R, PA-C  ondansetron (ZOFRAN ODT) 4 MG disintegrating tablet Take 1 tablet (4 mg total) by mouth every 8 (eight) hours as needed for nausea or vomiting. 06/23/19   Petrucelli, Samantha R, PA-C  potassium chloride SA (KLOR-CON) 20 MEQ tablet Take 1 tablet (20 mEq total) by mouth daily. 06/23/19   Petrucelli, Samantha R, PA-C  triamcinolone ointment (KENALOG) 0.1 % Apply 1 application topically 2 (two) times daily. 06/16/19   [provider]  metoCLOPramide (REGLAN) 10 MG tablet Take 1 tablet (10 mg total) by mouth every 8 (eight) hours as needed for nausea. Patient not taking: Reported on 12/06/2018 10/30/18 06/23/19  Ward, 06/25/19, DO  pantoprazole (PROTONIX) 20 MG tablet Take 1 tablet (20 mg total) by mouth daily. Patient not taking: Reported on  10/30/2018 02/22/18 10/30/18  11/01/18, PA-C  promethazine (PHENERGAN) 25 MG suppository Place 1 suppository (25 mg total) rectally every 6 (six) hours as needed for nausea or vomiting. Patient not taking: Reported on 10/30/2018 10/28/18 10/30/18  11/01/18, MD  sucralfate (CARAFATE) 1 g tablet Take 1 tablet (1 g total) by mouth 4 (four) times daily -  with meals and at bedtime for 7 days. Patient not taking: Reported on 10/30/2018 02/22/18 10/30/18  11/01/18                                                                                                                                    Past Surgical History Past Surgical History:  Procedure Laterality Date  . NO PAST SURGERIES     Family History Family History  Problem Relation Age of Onset  . Allergic rhinitis Father   . Asthma Father   .  Urticaria Father   . Asthma Paternal Aunt   . Eczema Paternal Uncle   . Asthma Paternal Grandfather     Social History Social History   Tobacco Use  . Smoking status: Former Smoker    Types: Cigarettes  . Smokeless tobacco: Never Used  Substance Use Topics  . Alcohol use: No  . Drug use: Yes    Types: Marijuana   Allergies Patient has no known allergies.  Review of Systems Review of Systems All other systems are reviewed and are negative for acute change except as noted in the HPI  Physical Exam Vital Signs  I have reviewed the triage vital signs BP (!) 129/97 (BP Location: Left Arm)   Pulse 67   Temp 98 F (36.7 C) (Oral)   Resp 16   Wt 90.7 kg   LMP 07/21/2019   SpO2 99%   BMI 34.32 kg/m   Physical Exam Vitals reviewed.  Constitutional:      General: She is not in acute distress.    Appearance: She is well-developed. She is not diaphoretic.  HENT:     Head: Normocephalic and atraumatic.     Right Ear: External ear normal.     Left Ear: External ear normal.     Nose: Nose normal.  Eyes:     General: No scleral icterus.    Conjunctiva/sclera:  Conjunctivae normal.  Neck:     Trachea: Phonation normal.  Cardiovascular:     Rate and Rhythm: Normal rate and regular rhythm.  Pulmonary:     Effort: Pulmonary effort is normal. No respiratory distress.     Breath sounds: No stridor.  Abdominal:     General: There is no distension.     Tenderness: There is no abdominal tenderness.  Musculoskeletal:        General: Normal range of motion.     Cervical back: Normal range of motion.  Neurological:     Mental Status: She is alert and oriented to person, place, and time.  Psychiatric:        Behavior: Behavior normal.     ED Results and Treatments Labs (all labs ordered are listed, but only abnormal results are displayed) Labs Reviewed  COMPREHENSIVE METABOLIC PANEL - Abnormal; Notable for the following components:      Result Value   Potassium 3.4 (*)    Glucose, Bld 138 (*)    Total Protein 8.6 (*)    Anion gap 17 (*)    All other components within normal limits  CBC - Abnormal; Notable for the following components:   WBC 13.6 (*)    Hemoglobin 15.5 (*)    HCT 46.6 (*)    All other components within normal limits  URINALYSIS, ROUTINE W REFLEX MICROSCOPIC - Abnormal; Notable for the following components:   Color, Urine AMBER (*)    APPearance CLOUDY (*)    Specific Gravity, Urine 1.038 (*)    Bilirubin Urine SMALL (*)    Ketones, ur 20 (*)    Protein, ur 100 (*)    Bacteria, UA RARE (*)    All other components within normal limits  LIPASE, BLOOD  I-STAT BETA HCG BLOOD, ED ([facility], [facility], [facility] ONLY)  EKG  EKG Interpretation  Date/Time:    Ventricular Rate:    PR Interval:    QRS Duration:   QT Interval:    QTC Calculation:   R Axis:     Text Interpretation:        Radiology No results found.  Pertinent labs & imaging results that were available during my care of the patient were reviewed by me  and considered in my medical decision making (see chart for details).  Medications Ordered in ED Medications  sodium chloride flush (NS) 0.9 % injection 3 mL (has no administration in time range)  metoCLOPramide (REGLAN) injection 10 mg (10 mg Intramuscular Given 08/11/19 0555)  ondansetron (ZOFRAN-ODT) disintegrating tablet 4 mg (4 mg Oral Given 08/11/19 0555)  alum & mag hydroxide-simeth (MAALOX/MYLANTA) 200-200-20 MG/5ML suspension 30 mL (30 mLs Oral Given 08/11/19 0554)    And  lidocaine (XYLOCAINE) 2 % viscous mouth solution 15 mL (15 mLs Oral Given 08/11/19 0554)  hyoscyamine (LEVSIN SL) SL tablet 0.25 mg (0.25 mg Sublingual Given 08/11/19 0554)                                                                                                                                    Procedures Procedures  (including critical care time)  Medical Decision Making / ED Course I have reviewed the nursing notes for this encounter and the patient's prior records (if available in EHR or on provided paperwork).   Debra la L Mesch was evaluated in Emergency Department on 08/11/2019 for the symptoms described in the history of present illness. She was evaluated in the context of the global COVID-19 pandemic, which necessitated consideration that the patient might be at risk for infection with the SARS-CoV-2 virus that causes COVID-19. Institutional protocols and algorithms that pertain to the evaluation of patients at risk for COVID-19 are in a state of rapid change based on information released by regulatory bodies including the CDC and federal and state organizations. These policies and algorithms were followed during the patient's care in the ED.  Nausea and vomiting consistent with prior cyclical vomiting episodes. Abdomen benign. Labs with mild leukocytosis. CBC and UA is hemoconcentrated.  Not suspicious for urinary tract infection.  No evidence of bili obstruction or pancreatitis on labs.  Treated  symptomatically.  Able to tolerate oral intake.     Final Clinical Impression(s) / ED Diagnoses Final diagnoses:  Cyclic vomiting syndrome   The patient appears reasonably screened and/or stabilized for discharge and I doubt any other medical condition or other [facility] requiring further screening, evaluation, or treatment in the ED at this time prior to discharge. Safe for discharge with strict return precautions.  Disposition: Discharge  Condition: Good  I have discussed the results, Dx and Tx plan with the patient/family who expressed understanding and agree(s) with the plan. Discharge instructions discussed at length. The patient/family was given strict return precautions who verbalized understanding of  the instructions. No further questions at time of discharge.    ED Discharge Orders    None       Follow Up: Primary care provider  Schedule an appointment as soon as possible for a visit  As needed      This chart was dictated using voice recognition software.  Despite best efforts to proofread,  errors can occur which can change the documentation meaning.   Nira Conn, MD 08/11/19 [phone]

## 2019-08-11 NOTE — ED Provider Notes (Signed)
11:42 PM Assumed care from Dr. Judd Lien, please see their note for full history, physical and decision making until this point. In brief this is a 24 y.o. year old female who presented to the ED tonight with Emesis     Vomiting. Looks good. Pending labs, fluids and likely discharge.   Tolerating PO. Hypokalemia repleted with PO/IV medications. Mg as well. Stable for dc.   Discharge instructions, including strict return precautions for new or worsening symptoms, given. Patient and/or family verbalized understanding and agreement with the plan as described.   Labs, studies and imaging reviewed by myself and considered in medical decision making if ordered. Imaging interpreted by radiology.  Labs Reviewed  CBC WITH DIFFERENTIAL/PLATELET - Abnormal; Notable for the following components:      Result Value   WBC 16.1 (*)    Hemoglobin 15.1 (*)    Neutro Abs 13.6 (*)    Monocytes Absolute 1.3 (*)    All other components within normal limits  URINALYSIS, ROUTINE W REFLEX MICROSCOPIC - Abnormal; Notable for the following components:   APPearance CLOUDY (*)    Ketones, ur 15 (*)    Protein, ur 30 (*)    Leukocytes,Ua SMALL (*)    All other components within normal limits  RAPID URINE DRUG SCREEN, HOSP PERFORMED - Abnormal; Notable for the following components:   Tetrahydrocannabinol POSITIVE (*)    All other components within normal limits  URINALYSIS, MICROSCOPIC (REFLEX) - Abnormal; Notable for the following components:   Bacteria, UA FEW (*)    All other components within normal limits  PREGNANCY, URINE  COMPREHENSIVE METABOLIC PANEL  LIPASE, BLOOD    No orders to display    No follow-ups on file.    Delano Scardino, Barbara Cower, MD 08/12/19 [phone]

## 2019-08-12 LAB — MAGNESIUM: Magnesium: 2 mg/dL (ref 1.7–2.4)

## 2019-08-12 MED ORDER — POTASSIUM CHLORIDE 10 MEQ/100ML IV SOLN
10.0000 meq | INTRAVENOUS | Status: AC
  Administered 2019-08-12 (×2): 10 meq via INTRAVENOUS
  Filled 2019-08-12 (×2): qty 100

## 2019-08-12 MED ORDER — POTASSIUM CHLORIDE CRYS ER 20 MEQ PO TBCR: 40.0000 meq | EXTENDED_RELEASE_TABLET | Freq: Two times a day (BID) | ORAL | 0 refills | Status: AC

## 2019-08-12 MED ORDER — MAGNESIUM SULFATE 2 GM/50ML IV SOLN
2.0000 g | Freq: Once | INTRAVENOUS | Status: AC
  Administered 2019-08-12: 2 g via INTRAVENOUS
  Filled 2019-08-12: qty 50

## 2019-08-12 MED ORDER — POTASSIUM CHLORIDE CRYS ER 20 MEQ PO TBCR
40.0000 meq | EXTENDED_RELEASE_TABLET | Freq: Once | ORAL | Status: AC
  Administered 2019-08-12: 02:00:00 40 meq via ORAL
  Filled 2019-08-12: qty 2

## 2019-08-12 MED ORDER — METOCLOPRAMIDE HCL 10 MG PO TABS: 10.0000 mg | ORAL_TABLET | Freq: Four times a day (QID) | ORAL | 0 refills | Status: AC | PRN

## 2019-08-12 NOTE — ED Notes (Signed)
EDP Mesner verbalized no need for third bag of potassium

## 2020-02-08 ENCOUNTER — Encounter (HOSPITAL_BASED_OUTPATIENT_CLINIC_OR_DEPARTMENT_OTHER): Payer: Self-pay | Admitting: Emergency Medicine

## 2020-02-08 ENCOUNTER — Emergency Department (HOSPITAL_BASED_OUTPATIENT_CLINIC_OR_DEPARTMENT_OTHER)
Admission: EM | Admit: 2020-02-08 | Discharge: 2020-02-08 | Disposition: A | Discharge status: Home / Self Care | Payer: BC Managed Care – PPO | Attending: Emergency Medicine | Admitting: Emergency Medicine

## 2020-02-08 ENCOUNTER — Other Ambulatory Visit: Payer: Self-pay

## 2020-02-08 DIAGNOSIS — R112 Nausea with vomiting, unspecified: Secondary | ICD-10-CM | POA: Diagnosis not present

## 2020-02-08 DIAGNOSIS — Z87891 Personal history of nicotine dependence: Secondary | ICD-10-CM | POA: Diagnosis not present

## 2020-02-08 DIAGNOSIS — E860 Dehydration: Secondary | ICD-10-CM | POA: Insufficient documentation

## 2020-02-08 DIAGNOSIS — E876 Hypokalemia: Secondary | ICD-10-CM | POA: Insufficient documentation

## 2020-02-08 LAB — URINALYSIS, ROUTINE W REFLEX MICROSCOPIC
Glucose, UA: NEGATIVE mg/dL
Hgb urine dipstick: NEGATIVE
Ketones, ur: 40 mg/dL — AB
Nitrite: NEGATIVE
Protein, ur: 100 mg/dL — AB
Specific Gravity, Urine: 1.025 (ref 1.005–1.030)
pH: 6.5 (ref 5.0–8.0)

## 2020-02-08 LAB — CBC
HCT: 45.3 % (ref 36.0–46.0)
Hemoglobin: 15.9 g/dL — ABNORMAL HIGH (ref 12.0–15.0)
MCH: 33.3 pg (ref 26.0–34.0)
MCHC: 35.1 g/dL (ref 30.0–36.0)
MCV: 95 fL (ref 80.0–100.0)
Platelets: 253 10*3/uL (ref 150–400)
RBC: 4.77 MIL/uL (ref 3.87–5.11)
RDW: 12.7 % (ref 11.5–15.5)
WBC: 14.2 10*3/uL — ABNORMAL HIGH (ref 4.0–10.5)
nRBC: 0 % (ref 0.0–0.2)

## 2020-02-08 LAB — COMPREHENSIVE METABOLIC PANEL
ALT: 40 U/L (ref 0–44)
AST: 29 U/L (ref 15–41)
Albumin: 4.8 g/dL (ref 3.5–5.0)
Alkaline Phosphatase: 60 U/L (ref 38–126)
Anion gap: 17 — ABNORMAL HIGH (ref 5–15)
BUN: 20 mg/dL (ref 6–20)
CO2: 21 mmol/L — ABNORMAL LOW (ref 22–32)
Calcium: 9.6 mg/dL (ref 8.9–10.3)
Chloride: 102 mmol/L (ref 98–111)
Creatinine, Ser: 0.95 mg/dL (ref 0.44–1.00)
GFR calc non Af Amer: >60 mL/min (ref >60)
Glucose, Bld: 94 mg/dL (ref 70–99)
Potassium: 3.1 mmol/L — ABNORMAL LOW (ref 3.5–5.1)
Sodium: 140 mmol/L (ref 135–145)
Total Bilirubin: 1.6 mg/dL — ABNORMAL HIGH (ref 0.3–1.2)
Total Protein: 8.8 g/dL — ABNORMAL HIGH (ref 6.5–8.1)

## 2020-02-08 LAB — URINALYSIS, MICROSCOPIC (REFLEX)

## 2020-02-08 LAB — PREGNANCY, URINE: Preg Test, Ur: NEGATIVE

## 2020-02-08 LAB — PATHOLOGIST SMEAR REVIEW

## 2020-02-08 LAB — LIPASE, BLOOD: Lipase: 25 U/L (ref 11–51)

## 2020-02-08 MED ORDER — POTASSIUM CHLORIDE CRYS ER 20 MEQ PO TBCR
40.0000 meq | EXTENDED_RELEASE_TABLET | Freq: Once | ORAL | Status: AC
  Administered 2020-02-08: 40 meq via ORAL
  Filled 2020-02-08: qty 2

## 2020-02-08 MED ORDER — METOCLOPRAMIDE HCL 10 MG PO TABS: 10.0000 mg | ORAL_TABLET | Freq: Four times a day (QID) | ORAL | 0 refills | Status: AC

## 2020-02-08 MED ORDER — METOCLOPRAMIDE HCL 5 MG/ML IJ SOLN
10.0000 mg | Freq: Once | INTRAMUSCULAR | Status: AC
  Administered 2020-02-08: 10 mg via INTRAVENOUS
  Filled 2020-02-08: qty 2

## 2020-02-08 MED ORDER — SODIUM CHLORIDE 0.9 % IV BOLUS
1000.0000 mL | Freq: Once | INTRAVENOUS | Status: AC
  Administered 2020-02-08: 1000 mL via INTRAVENOUS

## 2020-02-08 NOTE — ED Notes (Signed)
Pt drinking gatorade and eating crackers without problems

## 2020-02-08 NOTE — ED Notes (Signed)
Pt sipping on gatorade

## 2020-02-08 NOTE — ED Provider Notes (Signed)
[facility] EMERGENCY DEPARTMENT Provider Note   CSN: [id number] Arrival date & time: 02/08/20  4098     History Chief Complaint  Patient presents with  . Emesis    Debra Thornton is a 24 y.o. female.  The history is provided by the patient and medical records. No language interpreter was used.  Emesis Severity:  Severe Duration:  3 days Quality:  Stomach contents Progression:  Unchanged Chronicity:  Recurrent Recent urination:  Decreased Relieved by:  Antiemetics Worsened by:  Nothing Ineffective treatments:  None tried Associated symptoms: no abdominal pain, no chills, no cough, no diarrhea, no fever, no headaches, no sore throat and no URI   Risk factors: alcohol use   Risk factors: no sick contacts        Past Medical History:  Diagnosis Date  . Colitis   . Esophageal yeast infection (HCC)   . Pancreatitis     Patient Active Problem List   Diagnosis Date Noted  . Hypokalemia 03/10/2016  . Acute kidney injury (HCC) 03/10/2016  . Nausea and vomiting 03/08/2016    Past Surgical History:  Procedure Laterality Date  . NO PAST SURGERIES       OB History   No obstetric history on file.     Family History  Problem Relation Age of Onset  . Allergic rhinitis Father   . Asthma Father   . Urticaria Father   . Asthma Paternal Aunt   . Eczema Paternal Uncle   . Asthma Paternal Grandfather     Social History   Tobacco Use  . Smoking status: Former Smoker    Types: Cigarettes  . Smokeless tobacco: Never Used  Vaping Use  . Vaping Use: Never used  Substance Use Topics  . Alcohol use: Yes    Comment: occ  . Drug use: Yes    Types: Marijuana    Home Medications Prior to Admission medications   Medication Sig Start Date End Date Taking? Authorizing Provider  chlordiazePOXIDE (LIBRIUM) 25 MG capsule 50mg  PO TID x 1D, then 25-50mg  PO BID X 1D, then 25-50mg  PO QD X 1D 06/23/19   Petrucelli, Samantha R, PA-C  metoCLOPramide (REGLAN) 10 MG  tablet Take 1 tablet (10 mg total) by mouth every 6 (six) hours as needed for nausea (nausea/headache). 08/12/19   Mesner, 10/12/19, MD  potassium chloride SA (KLOR-CON) 20 MEQ tablet Take 2 tablets (40 mEq total) by mouth 2 (two) times daily for 7 days. 08/12/19 08/19/19  Mesner, 08/21/19, MD  triamcinolone ointment (KENALOG) 0.1 % Apply 1 application topically 2 (two) times daily. 06/16/19   [provider]  pantoprazole (PROTONIX) 20 MG tablet Take 1 tablet (20 mg total) by mouth daily. Patient not taking: Reported on 10/30/2018 02/22/18 10/30/18  11/01/18, PA-C  promethazine (PHENERGAN) 25 MG suppository Place 1 suppository (25 mg total) rectally every 6 (six) hours as needed for nausea or vomiting. Patient not taking: Reported on 10/30/2018 10/28/18 10/30/18  11/01/18, MD  sucralfate (CARAFATE) 1 g tablet Take 1 tablet (1 g total) by mouth 4 (four) times daily -  with meals and at bedtime for 7 days. Patient not taking: Reported on 10/30/2018 02/22/18 10/30/18  11/01/18, PA-C    Allergies    Patient has no known allergies.  Review of Systems   Review of Systems  Constitutional: Negative for chills, diaphoresis, fatigue and fever.  HENT: Negative for congestion and sore throat.   Eyes: Negative for visual disturbance.  Respiratory: Negative for cough, chest tightness, shortness of breath and wheezing.   Cardiovascular: Negative for chest pain, palpitations and leg swelling.  Gastrointestinal: Positive for nausea and vomiting. Negative for abdominal pain, constipation and diarrhea.  Genitourinary: Positive for decreased urine volume. Negative for dysuria and flank pain.  Musculoskeletal: Negative for back pain, neck pain and neck stiffness.  Skin: Negative for rash and wound.  Neurological: Negative for dizziness, light-headedness and headaches.  Psychiatric/Behavioral: Negative for agitation and confusion.  All other systems reviewed and are negative.   Physical  Exam Updated Vital Signs BP (!) 137/104 (BP Location: Left Arm)   Pulse (!) 109   Temp 98.8 F (37.1 C) (Oral)   Resp 20   Ht 5\' 5"  (1.651 m)   Wt 89.9 kg   LMP 01/09/2020 (Approximate)   SpO2 98%   BMI 32.98 kg/m   Physical Exam Vitals and nursing note reviewed.  Constitutional:      General: She is not in acute distress.    Appearance: She is well-developed. She is not ill-appearing, toxic-appearing or diaphoretic.  HENT:     Head: Normocephalic and atraumatic.     Mouth/Throat:     Mouth: Mucous membranes are dry.     Pharynx: No oropharyngeal exudate or posterior oropharyngeal erythema.  Eyes:     Extraocular Movements: Extraocular movements intact.     Conjunctiva/sclera: Conjunctivae normal.     Pupils: Pupils are equal, round, and reactive to light.  Cardiovascular:     Rate and Rhythm: Normal rate and regular rhythm.     Heart sounds: No murmur heard.   Pulmonary:     Effort: Pulmonary effort is normal. No respiratory distress.     Breath sounds: Normal breath sounds. No wheezing, rhonchi or rales.  Chest:     Chest wall: No tenderness.  Abdominal:     General: Abdomen is flat.     Palpations: Abdomen is soft.     Tenderness: There is no abdominal tenderness. There is no guarding or rebound.  Musculoskeletal:        General: No tenderness.     Cervical back: Neck supple.     Right lower leg: No edema.     Left lower leg: No edema.  Skin:    General: Skin is warm and dry.     Capillary Refill: Capillary refill takes less than 2 seconds.     Findings: No erythema.  Neurological:     General: No focal deficit present.     Mental Status: She is alert.  Psychiatric:        Mood and Affect: Mood normal.     ED Results / Procedures / Treatments   Labs (all labs ordered are listed, but only abnormal results are displayed) Labs Reviewed  CBC - Abnormal; Notable for the following components:      Result Value   WBC 14.2 (*)    Hemoglobin 15.9 (*)    All  other components within normal limits  COMPREHENSIVE METABOLIC PANEL - Abnormal; Notable for the following components:   Potassium 3.1 (*)    CO2 21 (*)    Total Protein 8.8 (*)    Total Bilirubin 1.6 (*)    Anion gap 17 (*)    All other components within normal limits  URINALYSIS, ROUTINE W REFLEX MICROSCOPIC - Abnormal; Notable for the following components:   APPearance TURBID (*)    Bilirubin Urine MODERATE (*)    Ketones, ur 40 (*)  Protein, ur 100 (*)    Leukocytes,Ua TRACE (*)    All other components within normal limits  URINALYSIS, MICROSCOPIC (REFLEX) - Abnormal; Notable for the following components:   Bacteria, UA MANY (*)    All other components within normal limits  URINE CULTURE  LIPASE, BLOOD  PREGNANCY, URINE    EKG None  Radiology No results found.  Procedures Procedures (including critical care time)  Medications Ordered in ED Medications  metoCLOPramide (REGLAN) injection 10 mg (10 mg Intravenous Given 02/08/20 0813)  sodium chloride 0.9 % bolus 1,000 mL (0 mLs Intravenous Stopped 02/08/20 0924)  potassium chloride SA (KLOR-CON) CR tablet 40 mEq (40 mEq Oral Given 02/08/20 [id number])    ED Course  I have reviewed the triage vital signs and the nursing notes.  Pertinent labs & imaging results that were available during my care of the patient were reviewed by me and considered in my medical decision making (see chart for details).    MDM Rules/Calculators/A&P                          Debra la L Mayer Camelatum is a 24 y.o. female with a past medical history significant for pancreatitis, prior nausea/vomiting, hypokalemia who presents with nausea, vomiting, dehydration.  Patient reports that she had a heavy night of drinking on Saturday, 4 days ago, and since then has had nearly constant nausea and vomiting and feeling dehydrated.  She reports has not been able to tolerate much as far as food or fluids since Saturday night when she drank.  She denies any specific pains  with no abdominal pain or chest pain.  She denies any head injuries, headache, neck pain, or other complaints.  She denies constipation or diarrhea.  She denies any urinary symptoms but she does feel that her urine is darker.  She denies any pelvic problems.  She reports that she has used some nausea medication at home which is helped slightly.  She denies any Covid exposures or other coronavirus complaints.  No cough, congestion, fevers, chills, chest pain, shortness of breath.  On exam, abdomen is nontender.  Bowel sounds were appreciated.  Lungs were clear.  No murmur.  Good pulses in extremities.  Patient resting comfortably with nausea.  Mucous membranes were dry on exam.  Due to dehydration, will give her some fluids we will check electrolytes look for electrolyte imbalance or AKI given her history of this.  We will give her some Reglan as she reports this helped the most during previous visits for nausea and vomiting.  Anticipate reassessment after rehydration and medications and fluids.  If she is doing better, anticipate discharge home with nausea medication.       Work-up began to return.  Patient does have mild leukocytosis and elevated hemoglobin likely due to hemoconcentration.  Potassium is low but not as low as it has been in the past.  We will give some oral potassium segmentation.  LFTs not elevated.  No AKI this time.  Lipase not elevated.  Pregnancy test negative.  Urinalysis does not show nitrites, given lack of urinary symptoms, doubt UTI.  Culture will be sent given the leukocytes and bacteria.  Patient felt much better after fluids and nausea medication.  She would like to try going home now she can tolerate p.o.  Patient given prescription for more Reglan and will continue hydration at home.  She understood return precautions and plan of care.  She no other questions  or concerns and was discharged in good condition with improved symptoms.    Final Clinical Impression(s) / ED  Diagnoses Final diagnoses:  Intractable vomiting with nausea, unspecified vomiting type  Hypokalemia  Dehydration    Rx / DC Orders ED Discharge Orders         Ordered    metoCLOPramide (REGLAN) 10 MG tablet  Every 6 hours        02/08/20 0956          Clinical Impression: 1. Intractable vomiting with nausea, unspecified vomiting type   2. Hypokalemia   3. Dehydration     Disposition: Discharge  Condition: Good  I have discussed the results, Dx and Tx plan with the pt(& family if present). He/she/they expressed understanding and agree(s) with the plan. Discharge instructions discussed at great length. Strict return precautions discussed and pt &/or family have verbalized understanding of the instructions. No further questions at time of discharge.    New Prescriptions   METOCLOPRAMIDE (REGLAN) 10 MG TABLET    Take 1 tablet (10 mg total) by mouth every 6 (six) hours.    Follow Up: [facility] [place] [address] [place] [address] [phone]     [facility] EMERGENCY DEPARTMENT [id number] [place] [address] [phone]       Aamori Mcmasters, Canary Brim, MD 02/08/20 1000

## 2020-02-08 NOTE — Discharge Instructions (Signed)
Your work-up today showed slightly decreased potassium again but it was improved from last time.  We repleted orally.  We felt you are likely dehydrated based on your lab work and description of symptoms.  Please use the nausea medicine to help maintain hydration and rest.  Please avoid increased alcohol intake.  Your pancreas function was not elevated and your kidney function was normal today as well.  If any symptoms change or worsen, please return to the nearest emergency department.

## 2020-02-08 NOTE — ED Triage Notes (Signed)
Pt states she has been vomiting since Sunday morning  Pt states she went out Saturday night and is not sure if she has alcohol poisoning  Pt states she has only been able to hold down ice chips

## 2020-02-09 ENCOUNTER — Other Ambulatory Visit: Payer: Self-pay

## 2020-02-09 ENCOUNTER — Emergency Department (HOSPITAL_BASED_OUTPATIENT_CLINIC_OR_DEPARTMENT_OTHER)
Admission: EM | Admit: 2020-02-09 | Discharge: 2020-02-09 | Disposition: A | Discharge status: Home / Self Care | Payer: BC Managed Care – PPO | Attending: Emergency Medicine | Admitting: Emergency Medicine

## 2020-02-09 DIAGNOSIS — R079 Chest pain, unspecified: Secondary | ICD-10-CM | POA: Diagnosis not present

## 2020-02-09 DIAGNOSIS — F1290 Cannabis use, unspecified, uncomplicated: Secondary | ICD-10-CM

## 2020-02-09 DIAGNOSIS — R112 Nausea with vomiting, unspecified: Secondary | ICD-10-CM | POA: Insufficient documentation

## 2020-02-09 DIAGNOSIS — Z87891 Personal history of nicotine dependence: Secondary | ICD-10-CM | POA: Diagnosis not present

## 2020-02-09 DIAGNOSIS — F12188 Cannabis abuse with other cannabis-induced disorder: Secondary | ICD-10-CM | POA: Diagnosis not present

## 2020-02-09 LAB — BASIC METABOLIC PANEL
Anion gap: 16 — ABNORMAL HIGH (ref 5–15)
BUN: 14 mg/dL (ref 6–20)
CO2: 19 mmol/L — ABNORMAL LOW (ref 22–32)
Calcium: 9.6 mg/dL (ref 8.9–10.3)
Chloride: 102 mmol/L (ref 98–111)
Creatinine, Ser: 0.91 mg/dL (ref 0.44–1.00)
GFR calc non Af Amer: >60 mL/min (ref >60)
Glucose, Bld: 88 mg/dL (ref 70–99)
Potassium: 3.4 mmol/L — ABNORMAL LOW (ref 3.5–5.1)
Sodium: 137 mmol/L (ref 135–145)

## 2020-02-09 LAB — URINALYSIS, ROUTINE W REFLEX MICROSCOPIC
Glucose, UA: NEGATIVE mg/dL
Hgb urine dipstick: NEGATIVE
Ketones, ur: >80 mg/dL — AB
Nitrite: NEGATIVE
Protein, ur: 30 mg/dL — AB
Specific Gravity, Urine: 1.025 (ref 1.005–1.030)
pH: 6.5 (ref 5.0–8.0)

## 2020-02-09 LAB — URINE CULTURE: Culture: NO GROWTH

## 2020-02-09 LAB — URINALYSIS, MICROSCOPIC (REFLEX)

## 2020-02-09 MED ORDER — ONDANSETRON HCL 4 MG/2ML IJ SOLN
4.0000 mg | Freq: Once | INTRAMUSCULAR | Status: AC
  Administered 2020-02-09: 4 mg via INTRAVENOUS
  Filled 2020-02-09: qty 2

## 2020-02-09 MED ORDER — POTASSIUM CHLORIDE CRYS ER 20 MEQ PO TBCR
40.0000 meq | EXTENDED_RELEASE_TABLET | Freq: Once | ORAL | Status: AC
  Administered 2020-02-09: 40 meq via ORAL
  Filled 2020-02-09: qty 2

## 2020-02-09 MED ORDER — FAMOTIDINE IN NACL 20-0.9 MG/50ML-% IV SOLN
20.0000 mg | Freq: Once | INTRAVENOUS | Status: AC
  Administered 2020-02-09: 20 mg via INTRAVENOUS
  Filled 2020-02-09: qty 50

## 2020-02-09 MED ORDER — SODIUM CHLORIDE 0.9 % IV BOLUS
1000.0000 mL | Freq: Once | INTRAVENOUS | Status: AC
  Administered 2020-02-09: 1000 mL via INTRAVENOUS

## 2020-02-09 MED ORDER — LORAZEPAM 2 MG/ML IJ SOLN
1.0000 mg | Freq: Once | INTRAMUSCULAR | Status: AC
  Administered 2020-02-09: 1 mg via INTRAVENOUS
  Filled 2020-02-09: qty 1

## 2020-02-09 NOTE — ED Triage Notes (Signed)
Pt reports n/v since Sunday along with chest pain that is described as an aching pain that occurs upon taking a deep breath. Pt states she was seen here yesterday for same symptoms. Pt a&O in NAD.

## 2020-02-09 NOTE — Discharge Instructions (Addendum)
It was our pleasure to provide your ER care today - we hope that you feel better.  Rest. Drink plenty of fluids, advance diet as tolerated. Take your nausea meds as need.   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   From today's labs, your potassium level is mildly low - eat plenty of fruits and vegetables, and follow up with your doctor.   Follow up with primary care doctor in the coming week.  Return to ER if worse, new symptoms, fevers, worsening or severe pain, persistent vomiting, or other concern.  You were given medication in the ER - no driving for the next 6 hours.

## 2020-02-09 NOTE — ED Notes (Signed)
Pt tolerated PO fluids

## 2020-02-09 NOTE — ED Provider Notes (Signed)
[facility] EMERGENCY DEPARTMENT Provider Note   CSN: [id number] Arrival date & time: 02/09/20  0818     History Chief Complaint  Patient presents with  . Emesis    Debra Thornton is a 24 y.o. female.  Patient with history recurrent vomiting syndrome, and THC use, c/o recurrent nausea/vomiting in the past 24 hours. Symptoms acute onset, moderate, constant, episodic/recurrent, worse today. States same as prior recurrent vomiting symptoms.  Emesis clear, not bloody or bilious. No abd distension. No prior abd surgery. Is having normal bms. No fever or chills. No hx pud, pancreatitis, or gallstones. No dysuria or gu c/o. No back or flank pain. No chest pain or sob. No headaches.   The history is provided by the patient.  Emesis Associated symptoms: no abdominal pain, no chills, no cough, no fever, no headaches and no sore throat        Past Medical History:  Diagnosis Date  . Colitis   . Esophageal yeast infection (HCC)   . Pancreatitis     Patient Active Problem List   Diagnosis Date Noted  . Hypokalemia 03/10/2016  . Acute kidney injury (HCC) 03/10/2016  . Nausea and vomiting 03/08/2016    Past Surgical History:  Procedure Laterality Date  . NO PAST SURGERIES       OB History   No obstetric history on file.     Family History  Problem Relation Age of Onset  . Allergic rhinitis Father   . Asthma Father   . Urticaria Father   . Asthma Paternal Aunt   . Eczema Paternal Uncle   . Asthma Paternal Grandfather     Social History   Tobacco Use  . Smoking status: Former Smoker    Types: Cigarettes  . Smokeless tobacco: Never Used  Vaping Use  . Vaping Use: Never used  Substance Use Topics  . Alcohol use: Yes    Comment: occ  . Drug use: Yes    Types: Marijuana    Home Medications Prior to Admission medications   Medication Sig Start Date End Date Taking? Authorizing Provider  chlordiazePOXIDE (LIBRIUM) 25 MG capsule 50mg  PO TID x 1D, then  25-50mg  PO BID X 1D, then 25-50mg  PO QD X 1D 06/23/19   Petrucelli, Samantha R, PA-C  metoCLOPramide (REGLAN) 10 MG tablet Take 1 tablet (10 mg total) by mouth every 6 (six) hours as needed for nausea (nausea/headache). 08/12/19   Mesner, 10/12/19, MD  metoCLOPramide (REGLAN) 10 MG tablet Take 1 tablet (10 mg total) by mouth every 6 (six) hours. 02/08/20   Tegeler, 04/09/20, MD  potassium chloride SA (KLOR-CON) 20 MEQ tablet Take 2 tablets (40 mEq total) by mouth 2 (two) times daily for 7 days. 08/12/19 08/19/19  Mesner, 08/21/19, MD  triamcinolone ointment (KENALOG) 0.1 % Apply 1 application topically 2 (two) times daily. 06/16/19   [provider]  pantoprazole (PROTONIX) 20 MG tablet Take 1 tablet (20 mg total) by mouth daily. Patient not taking: Reported on 10/30/2018 02/22/18 10/30/18  11/01/18, PA-C  promethazine (PHENERGAN) 25 MG suppository Place 1 suppository (25 mg total) rectally every 6 (six) hours as needed for nausea or vomiting. Patient not taking: Reported on 10/30/2018 10/28/18 10/30/18  11/01/18, MD  sucralfate (CARAFATE) 1 g tablet Take 1 tablet (1 g total) by mouth 4 (four) times daily -  with meals and at bedtime for 7 days. Patient not taking: Reported on 10/30/2018 02/22/18 10/30/18  11/01/18, PA-C  Allergies    Patient has no known allergies.  Review of Systems   Review of Systems  Constitutional: Negative for chills and fever.  HENT: Negative for sore throat.   Eyes: Negative for redness.  Respiratory: Negative for cough and shortness of breath.   Cardiovascular: Negative for chest pain.  Gastrointestinal: Positive for nausea and vomiting. Negative for abdominal pain and constipation.  Genitourinary: Negative for dysuria and flank pain.  Musculoskeletal: Negative for back pain and neck pain.  Skin: Negative for rash.  Neurological: Negative for headaches.  Hematological: Does not bruise/bleed easily.  Psychiatric/Behavioral: Negative for  confusion.    Physical Exam Updated Vital Signs BP (!) 146/102   Pulse 96   Resp 20   Ht 1.651 m (5\' 5" )   Wt 89.8 kg   LMP 01/10/2020   SpO2 100%   BMI 32.95 kg/m   Physical Exam Vitals and nursing note reviewed.  Constitutional:      Appearance: Normal appearance. She is well-developed.  HENT:     Head: Atraumatic.     Nose: Nose normal.     Mouth/Throat:     Mouth: Mucous membranes are moist.     Pharynx: Oropharynx is clear.  Eyes:     General: No scleral icterus.    Conjunctiva/sclera: Conjunctivae normal.     Pupils: Pupils are equal, round, and reactive to light.  Neck:     Trachea: No tracheal deviation.  Cardiovascular:     Rate and Rhythm: Normal rate and regular rhythm.     Pulses: Normal pulses.     Heart sounds: Normal heart sounds. No murmur heard.  No friction rub. No gallop.   Pulmonary:     Effort: Pulmonary effort is normal. No respiratory distress.     Breath sounds: Normal breath sounds.  Abdominal:     General: Bowel sounds are normal. There is no distension.     Palpations: Abdomen is soft. There is no mass.     Tenderness: There is abdominal tenderness. There is no guarding or rebound.     Hernia: No hernia is present.     Comments: Mild epigastric tenderness.   Genitourinary:    Comments: No cva tenderness.  Musculoskeletal:        General: No swelling.     Cervical back: Normal range of motion and neck supple. No rigidity. No muscular tenderness.  Skin:    General: Skin is warm and dry.     Findings: No rash.  Neurological:     Mental Status: She is alert.     Comments: Alert, speech normal. Steady gait.  Psychiatric:        Mood and Affect: Mood normal.     ED Results / Procedures / Treatments   Labs (all labs ordered are listed, but only abnormal results are displayed) Results for orders placed or performed during the hospital encounter of 02/09/20  Basic metabolic panel  Result Value Ref Range   Sodium 137 135 - 145 mmol/L    Potassium 3.4 (L) 3.5 - 5.1 mmol/L   Chloride 102 98 - 111 mmol/L   CO2 19 (L) 22 - 32 mmol/L   Glucose, Bld 88 70 - 99 mg/dL   BUN 14 6 - 20 mg/dL   Creatinine, Ser 04/10/20 0.44 - 1.00 mg/dL   Calcium 9.6 8.9 - 1.61 mg/dL   GFR calc non Af Amer >60 >60 mL/min   Anion gap 16 (H) 5 - 15  Urinalysis, Routine w reflex  microscopic Urine, Clean Catch  Result Value Ref Range   Color, Urine ORANGE (A) YELLOW   APPearance HAZY (A) CLEAR   Specific Gravity, Urine 1.025 1.005 - 1.030   pH 6.5 5.0 - 8.0   Glucose, UA NEGATIVE NEGATIVE mg/dL   Hgb urine dipstick NEGATIVE NEGATIVE   Bilirubin Urine MODERATE (A) NEGATIVE   Ketones, ur >80 (A) NEGATIVE mg/dL   Protein, ur 30 (A) NEGATIVE mg/dL   Nitrite NEGATIVE NEGATIVE   Leukocytes,Ua TRACE (A) NEGATIVE  Urinalysis, Microscopic (reflex)  Result Value Ref Range   RBC / HPF 6-10 0 - 5 RBC/hpf   WBC, UA 6-10 0 - 5 WBC/hpf   Bacteria, UA MANY (A) NONE SEEN   Squamous Epithelial / LPF 11-20 0 - 5   Mucus PRESENT    EKG EKG Interpretation  Date/Time:  Thursday February 09 2020 08:27:15 EDT Ventricular Rate:  88 PR Interval:    QRS Duration: 89 QT Interval:  372 QTC Calculation: 451 R Axis:   59 Text Interpretation: Sinus rhythm Atrial premature complex Confirmed by Cathren Laine ([id number]) on 02/09/2020 8:31:28 AM   Radiology No results found.  Procedures Procedures (including critical care time)  Medications Ordered in ED Medications  sodium chloride 0.9 % bolus 1,000 mL (has no administration in time range)  LORazepam (ATIVAN) injection 1 mg (has no administration in time range)  ondansetron (ZOFRAN) injection 4 mg (has no administration in time range)  famotidine (PEPCID) IVPB 20 mg premix (has no administration in time range)    ED Course  I have reviewed the triage vital signs and the nursing notes.  Pertinent labs & imaging results that were available during my care of the patient were reviewed by me and considered in my  medical decision making (see chart for details).    MDM Rules/Calculators/A&P                          Iv ns bolus. zofran iv. pepcid iv. Ativan iv.  Reviewed nursing notes and prior charts for additional history.  History recurrent vomiting syndrome - multiple prior cts and u/s's negative for acute process. NM study also negative for cause of symptoms.  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  Yesterdays labs, u preg neg. Cbc c/w prior. k mildly low.   Additional ivf.  Trial of po fluids.   Labs reviewed/interpreted by me - k mildly low. kcl po.  Pt given 2 liters ns in ED, is tolerating po, abd is soft and nontender. No cva tenderness. No dysuria. Afebrile.   Pt currently appears stable for d/c.   Return precautions provided.      Final Clinical Impression(s) / ED Diagnoses Final diagnoses:  None    Rx / DC Orders ED Discharge Orders    None       Cathren Laine, MD 02/09/20 1151

## 2020-02-09 NOTE — ED Notes (Signed)
Pt given ice water for PO challenge. Pt states nausea is resolved.

## 2020-02-11 ENCOUNTER — Other Ambulatory Visit: Payer: Self-pay

## 2020-02-11 ENCOUNTER — Emergency Department (HOSPITAL_BASED_OUTPATIENT_CLINIC_OR_DEPARTMENT_OTHER): Payer: BC Managed Care – PPO

## 2020-02-11 ENCOUNTER — Encounter (HOSPITAL_BASED_OUTPATIENT_CLINIC_OR_DEPARTMENT_OTHER): Payer: Self-pay | Admitting: Emergency Medicine

## 2020-02-11 ENCOUNTER — Emergency Department (HOSPITAL_BASED_OUTPATIENT_CLINIC_OR_DEPARTMENT_OTHER)
Admission: EM | Admit: 2020-02-11 | Discharge: 2020-02-11 | Disposition: A | Discharge status: Home / Self Care | Payer: BC Managed Care – PPO | Attending: Emergency Medicine | Admitting: Emergency Medicine

## 2020-02-11 DIAGNOSIS — R079 Chest pain, unspecified: Secondary | ICD-10-CM | POA: Insufficient documentation

## 2020-02-11 DIAGNOSIS — N390 Urinary tract infection, site not specified: Secondary | ICD-10-CM | POA: Insufficient documentation

## 2020-02-11 DIAGNOSIS — R509 Fever, unspecified: Secondary | ICD-10-CM

## 2020-02-11 DIAGNOSIS — E876 Hypokalemia: Secondary | ICD-10-CM | POA: Diagnosis not present

## 2020-02-11 DIAGNOSIS — R112 Nausea with vomiting, unspecified: Secondary | ICD-10-CM

## 2020-02-11 DIAGNOSIS — Z20822 Contact with and (suspected) exposure to covid-19: Secondary | ICD-10-CM | POA: Diagnosis not present

## 2020-02-11 DIAGNOSIS — Z87891 Personal history of nicotine dependence: Secondary | ICD-10-CM | POA: Diagnosis not present

## 2020-02-11 LAB — CBC WITH DIFFERENTIAL/PLATELET
Abs Immature Granulocytes: 0.04 10*3/uL (ref 0.00–0.07)
Basophils Absolute: 0.1 10*3/uL (ref 0.0–0.1)
Basophils Relative: 0 %
Eosinophils Absolute: 0 10*3/uL (ref 0.0–0.5)
Eosinophils Relative: 0 %
HCT: 43.3 % (ref 36.0–46.0)
Hemoglobin: 15.2 g/dL — ABNORMAL HIGH (ref 12.0–15.0)
Immature Granulocytes: 0 %
Lymphocytes Relative: 12 %
Lymphs Abs: 1.5 10*3/uL (ref 0.7–4.0)
MCH: 33.3 pg (ref 26.0–34.0)
MCHC: 35.1 g/dL (ref 30.0–36.0)
MCV: 94.7 fL (ref 80.0–100.0)
Monocytes Absolute: 1 10*3/uL (ref 0.1–1.0)
Monocytes Relative: 8 %
Neutro Abs: 9.4 10*3/uL — ABNORMAL HIGH (ref 1.7–7.7)
Neutrophils Relative %: 80 %
Platelets: 228 10*3/uL (ref 150–400)
RBC: 4.57 MIL/uL (ref 3.87–5.11)
RDW: 11.9 % (ref 11.5–15.5)
WBC: 11.9 10*3/uL — ABNORMAL HIGH (ref 4.0–10.5)
nRBC: 0 % (ref 0.0–0.2)

## 2020-02-11 LAB — COMPREHENSIVE METABOLIC PANEL
ALT: 23 U/L (ref 0–44)
AST: 18 U/L (ref 15–41)
Albumin: 4.7 g/dL (ref 3.5–5.0)
Alkaline Phosphatase: 58 U/L (ref 38–126)
Anion gap: 16 — ABNORMAL HIGH (ref 5–15)
BUN: 11 mg/dL (ref 6–20)
CO2: 19 mmol/L — ABNORMAL LOW (ref 22–32)
Calcium: 9.3 mg/dL (ref 8.9–10.3)
Chloride: 104 mmol/L (ref 98–111)
Creatinine, Ser: 0.88 mg/dL (ref 0.44–1.00)
GFR, Estimated: >60 mL/min (ref >60)
Glucose, Bld: 89 mg/dL (ref 70–99)
Potassium: 2.9 mmol/L — ABNORMAL LOW (ref 3.5–5.1)
Sodium: 139 mmol/L (ref 135–145)
Total Bilirubin: 1.4 mg/dL — ABNORMAL HIGH (ref 0.3–1.2)
Total Protein: 8.2 g/dL — ABNORMAL HIGH (ref 6.5–8.1)

## 2020-02-11 LAB — URINALYSIS, ROUTINE W REFLEX MICROSCOPIC
Glucose, UA: 100 mg/dL — AB
Hgb urine dipstick: NEGATIVE
Ketones, ur: >80 mg/dL — AB
Nitrite: NEGATIVE
Protein, ur: 30 mg/dL — AB
Specific Gravity, Urine: 1.02 (ref 1.005–1.030)
pH: 8 (ref 5.0–8.0)

## 2020-02-11 LAB — URINALYSIS, MICROSCOPIC (REFLEX): RBC / HPF: NONE SEEN RBC/hpf (ref 0–5)

## 2020-02-11 LAB — LIPASE, BLOOD: Lipase: 29 U/L (ref 11–51)

## 2020-02-11 LAB — PREGNANCY, URINE: Preg Test, Ur: NEGATIVE

## 2020-02-11 LAB — RESPIRATORY PANEL BY RT PCR (FLU A&B, COVID)
Influenza A by PCR: NEGATIVE
Influenza B by PCR: NEGATIVE
SARS Coronavirus 2 by RT PCR: NEGATIVE

## 2020-02-11 LAB — TROPONIN I (HIGH SENSITIVITY): Troponin I (High Sensitivity): <2 ng/L (ref <18)

## 2020-02-11 MED ORDER — LACTATED RINGERS IV BOLUS: 1000.0000 mL | Freq: Once | INTRAVENOUS | Status: DC

## 2020-02-11 MED ORDER — POTASSIUM CHLORIDE ER 20 MEQ PO TBCR: 20.0000 meq | EXTENDED_RELEASE_TABLET | Freq: Every day | ORAL | 0 refills | Status: AC

## 2020-02-11 MED ORDER — CEFTRIAXONE SODIUM 1 G IJ SOLR
1.0000 g | Freq: Once | INTRAMUSCULAR | Status: AC
  Administered 2020-02-11: 1 g via INTRAMUSCULAR
  Filled 2020-02-11: qty 10

## 2020-02-11 MED ORDER — PROMETHAZINE HCL 25 MG RE SUPP: 25.0000 mg | Freq: Three times a day (TID) | RECTAL | 0 refills | Status: AC | PRN

## 2020-02-11 MED ORDER — PROMETHAZINE HCL 25 MG PO TABS: 25.0000 mg | ORAL_TABLET | Freq: Three times a day (TID) | ORAL | 0 refills | Status: AC | PRN

## 2020-02-11 MED ORDER — HALOPERIDOL LACTATE 5 MG/ML IJ SOLN
5.0000 mg | Freq: Once | INTRAMUSCULAR | Status: AC
  Administered 2020-02-11: 5 mg via INTRAMUSCULAR
  Filled 2020-02-11: qty 1

## 2020-02-11 MED ORDER — LIDOCAINE HCL (PF) 1 % IJ SOLN
INTRAMUSCULAR | Status: AC
  Administered 2020-02-11: 2.1 mL via INTRADERMAL
  Filled 2020-02-11: qty 5

## 2020-02-11 MED ORDER — LIDOCAINE HCL (PF) 1 % IJ SOLN: 5.0000 mL | Freq: Once | INTRAMUSCULAR | Status: AC

## 2020-02-11 MED ORDER — ACETAMINOPHEN 500 MG PO TABS
1000.0000 mg | ORAL_TABLET | Freq: Once | ORAL | Status: AC
  Administered 2020-02-11: 1000 mg via ORAL
  Filled 2020-02-11: qty 2

## 2020-02-11 MED ORDER — HALOPERIDOL LACTATE 5 MG/ML IJ SOLN: 5.0000 mg | Freq: Once | INTRAMUSCULAR | Status: DC

## 2020-02-11 MED ORDER — CEPHALEXIN 500 MG PO CAPS: 500.0000 mg | ORAL_CAPSULE | Freq: Two times a day (BID) | ORAL | 0 refills | Status: AC

## 2020-02-11 NOTE — ED Notes (Signed)
Pt tolerating po fluids

## 2020-02-11 NOTE — ED Notes (Signed)
Pt amb to [facility]

## 2020-02-11 NOTE — ED Notes (Signed)
ED Provider at bedside. 

## 2020-02-11 NOTE — ED Notes (Signed)
2 unsuccessful IV attempts, R forearm, R AC

## 2020-02-11 NOTE — ED Notes (Signed)
U/S tried by Augusto Gamble, RN and it was unsuccessful.  MD notified.

## 2020-02-11 NOTE — ED Provider Notes (Signed)
[facility] EMERGENCY DEPARTMENT Provider Note   CSN: [id number] Arrival date & time: 02/11/20  7425     History Chief Complaint  Patient presents with  . Emesis    Debra Thornton is a 24 y.o. female.  24 year old female with past medical history below including cyclical vomiting syndrome, pancreatitis, colitis who presents with vomiting.  Patient has had intermittent nausea and vomiting for the past 1 week, was evaluated and treated in the ED on 10/6 and 10/7 for the same.  Her vomiting started back up and she has not been able to keep anything down.  She reports current symptoms are similar to previous episodes of vomiting.  She was noted to have fever in triage, onset today.  She reports chest pain throughout this week that she describes as achy, has had previously w/ vomiting episodes. She denies any nasal congestion, cough, sore throat, diarrhea, urinary symptoms, sick contacts, or recent travel.  No rashes or skin problems.  She denies any marijuana or alcohol use since last evaluation and denies IV drug use.  She tried Reglan this morning with no relief.  The history is provided by the patient.  Emesis      Past Medical History:  Diagnosis Date  . Colitis   . Esophageal yeast infection (HCC)   . Pancreatitis     Patient Active Problem List   Diagnosis Date Noted  . Hypokalemia 03/10/2016  . Acute kidney injury (HCC) 03/10/2016  . Nausea and vomiting 03/08/2016    Past Surgical History:  Procedure Laterality Date  . NO PAST SURGERIES       OB History   No obstetric history on file.     Family History  Problem Relation Age of Onset  . Allergic rhinitis Father   . Asthma Father   . Urticaria Father   . Asthma Paternal Aunt   . Eczema Paternal Uncle   . Asthma Paternal Grandfather     Social History   Tobacco Use  . Smoking status: Former Smoker    Types: Cigarettes  . Smokeless tobacco: Never Used  Vaping Use  . Vaping Use: Never used    Substance Use Topics  . Alcohol use: Yes    Comment: occ  . Drug use: Yes    Types: Marijuana    Home Medications Prior to Admission medications   Medication Sig Start Date End Date Taking? Authorizing Provider  cephALEXin (KEFLEX) 500 MG capsule Take 1 capsule (500 mg total) by mouth 2 (two) times daily. 02/11/20   Josede Cicero, Ambrose Finland, MD  chlordiazePOXIDE (LIBRIUM) 25 MG capsule 50mg  PO TID x 1D, then 25-50mg  PO BID X 1D, then 25-50mg  PO QD X 1D 06/23/19   Petrucelli, Samantha R, PA-C  metoCLOPramide (REGLAN) 10 MG tablet Take 1 tablet (10 mg total) by mouth every 6 (six) hours as needed for nausea (nausea/headache). 08/12/19   Mesner, 10/12/19, MD  metoCLOPramide (REGLAN) 10 MG tablet Take 1 tablet (10 mg total) by mouth every 6 (six) hours. 02/08/20   Tegeler, 04/09/20, MD  potassium chloride 20 MEQ TBCR Take 20 mEq by mouth daily. 02/11/20   Rik Wadel, 04/12/20, MD  potassium chloride SA (KLOR-CON) 20 MEQ tablet Take 2 tablets (40 mEq total) by mouth 2 (two) times daily for 7 days. 08/12/19 08/19/19  Mesner, 08/21/19, MD  promethazine (PHENERGAN) 25 MG suppository Place 1 suppository (25 mg total) rectally every 8 (eight) hours as needed for nausea or vomiting. 02/11/20   Miyana Mordecai,  Ambrose Finland, MD  promethazine (PHENERGAN) 25 MG tablet Take 1 tablet (25 mg total) by mouth every 8 (eight) hours as needed for nausea or vomiting. 02/11/20   Florean Hoobler, Ambrose Finland, MD  triamcinolone ointment (KENALOG) 0.1 % Apply 1 application topically 2 (two) times daily. 06/16/19   [provider]  pantoprazole (PROTONIX) 20 MG tablet Take 1 tablet (20 mg total) by mouth daily. Patient not taking: Reported on 10/30/2018 02/22/18 10/30/18  Aviva Kluver B, PA-C  sucralfate (CARAFATE) 1 g tablet Take 1 tablet (1 g total) by mouth 4 (four) times daily -  with meals and at bedtime for 7 days. Patient not taking: Reported on 10/30/2018 02/22/18 10/30/18  Elisha Ponder, PA-C    Allergies    Patient has no  known allergies.  Review of Systems   Review of Systems  Gastrointestinal: Positive for vomiting.   All other systems reviewed and are negative except that which was mentioned in HPI  Physical Exam Updated Vital Signs BP (!) 134/92   Pulse 90   Temp (!) 101.8 F (38.8 C) (Oral)   Resp 18   Ht 5\' 5"  (1.651 m)   Wt 89.8 kg   LMP 01/12/2020 (Approximate)   SpO2 100%   BMI 32.95 kg/m   Physical Exam Vitals and nursing note reviewed.  Constitutional:      General: She is not in acute distress.    Appearance: She is well-developed. She is obese.     Comments: Spitting into emesis bag  HENT:     Head: Normocephalic and atraumatic.     Mouth/Throat:     Mouth: Mucous membranes are moist.     Pharynx: Oropharynx is clear. No oropharyngeal exudate or posterior oropharyngeal erythema.  Eyes:     Conjunctiva/sclera: Conjunctivae normal.  Cardiovascular:     Rate and Rhythm: Normal rate and regular rhythm.     Heart sounds: Normal heart sounds. No murmur heard.   Pulmonary:     Effort: Pulmonary effort is normal.     Breath sounds: Normal breath sounds.  Abdominal:     General: Bowel sounds are normal. There is no distension.     Palpations: Abdomen is soft.     Tenderness: There is no abdominal tenderness.  Musculoskeletal:        General: No tenderness.     Cervical back: Neck supple.     Right lower leg: No edema.     Left lower leg: No edema.  Skin:    General: Skin is warm and dry.  Neurological:     Mental Status: She is alert and oriented to person, place, and time.     Comments: Fluent speech  Psychiatric:        Judgment: Judgment normal.     ED Results / Procedures / Treatments   Labs (all labs ordered are listed, but only abnormal results are displayed) Labs Reviewed  COMPREHENSIVE METABOLIC PANEL - Abnormal; Notable for the following components:      Result Value   Potassium 2.9 (*)    CO2 19 (*)    Total Protein 8.2 (*)    Total Bilirubin 1.4 (*)     Anion gap 16 (*)    All other components within normal limits  CBC WITH DIFFERENTIAL/PLATELET - Abnormal; Notable for the following components:   WBC 11.9 (*)    Hemoglobin 15.2 (*)    Neutro Abs 9.4 (*)    All other components within normal limits  URINALYSIS,  ROUTINE W REFLEX MICROSCOPIC - Abnormal; Notable for the following components:   APPearance CLOUDY (*)    Glucose, UA 100 (*)    Bilirubin Urine MODERATE (*)    Ketones, ur >80 (*)    Protein, ur 30 (*)    Leukocytes,Ua MODERATE (*)    All other components within normal limits  URINALYSIS, MICROSCOPIC (REFLEX) - Abnormal; Notable for the following components:   Bacteria, UA MANY (*)    All other components within normal limits  RESPIRATORY PANEL BY RT PCR (FLU A&B, COVID)  URINE CULTURE  CULTURE, BLOOD (ROUTINE X 2)  CULTURE, BLOOD (ROUTINE X 2)  LIPASE, BLOOD  PREGNANCY, URINE  TROPONIN I (HIGH SENSITIVITY)    EKG None  Radiology DG Chest Portable 1 View  Result Date: 02/11/2020 CLINICAL DATA:  Fever.  Chest pain. EXAM: PORTABLE CHEST 1 VIEW COMPARISON:  Acute abdomen series 05/03/2016. FINDINGS: Midline trachea.  Normal heart size and mediastinal contours. Sharp costophrenic angles.  No pneumothorax.  Clear lungs. IMPRESSION: No active disease. Electronically Signed   By: Jeronimo Greaves M.D.   On: 02/11/2020 10:38    Procedures Procedures (including critical care time)  Medications Ordered in ED Medications  lactated ringers bolus 1,000 mL (has no administration in time range)  haloperidol lactate (HALDOL) injection 5 mg (5 mg Intramuscular Given 02/11/20 1124)  cefTRIAXone (ROCEPHIN) injection 1 g (1 g Intramuscular Given 02/11/20 1126)  lidocaine (PF) (XYLOCAINE) 1 % injection 5 mL (2.1 mLs Intradermal Given 02/11/20 1125)  acetaminophen (TYLENOL) tablet 1,000 mg (1,000 mg Oral Given 02/11/20 1149)    ED Course  I have reviewed the triage vital signs and the nursing notes.  Pertinent labs & imaging results  that were available during my care of the patient were reviewed by me and considered in my medical decision making (see chart for details).    MDM Rules/Calculators/A&P                          Pt w/ fever at presentation, 100% on RA and no URI symptoms. DDx includes UTI, viral illness such as COVID 19, cholecystitis although less likely given no pain.   Despite multiple IV attempts, unable to get access w/ [place]. I was able to get blood draw. Gave IM haldol for N/V, then tylenol. COVID/Flu/RSV negative. CXR clear. Labs appear very similar to previous 2 visits-- K 2.9, CO2 19, normal creatinine, AG 16 likely 2/2 dehydration, WBC 11.9 which is lower than last visits. LFTS and lipase normal. UA w/ moderate leuks, some WBCs, some squamous cells, and many bacteria. This is a possible source of infection as she has no abd tenderness, no viral URI symptoms, and clear CXR. Gave CTX and recommended UTI treatment w/ keflex.  Because we were unable to give IV fluids, observed pt for several hours and pushed oral fluids. Pt able to drink 32oz gatorade and resting comfortably on reassessment. Reports feeling better. Discussed treatment of hypokalemia and UTI and provided w/ phenergan (PO and PR) as needed for N/V at home. I have extensively reviewed return precautions with her including intractable vomiting, abdominal pain, persistent fevers, or sudden changes in symptoms. She voiced understanding.  Final Clinical Impression(s) / ED Diagnoses Final diagnoses:  Intractable vomiting with nausea, unspecified vomiting type  Fever, unspecified fever cause  Urinary tract infection without hematuria, site unspecified  Hypokalemia    Rx / DC Orders ED Discharge Orders  Ordered    cephALEXin (KEFLEX) 500 MG capsule  2 times daily        02/11/20 1422    promethazine (PHENERGAN) 25 MG tablet  Every 8 hours PRN        02/11/20 1422    promethazine (PHENERGAN) 25 MG suppository  Every 8 hours PRN         02/11/20 1422    potassium chloride 20 MEQ TBCR  Daily        02/11/20 1424           Mackenna Kamer, Ambrose Finlandachel Morgan, MD 02/11/20 1426

## 2020-02-11 NOTE — ED Notes (Signed)
ED Provider at bedside to attempt US IV 

## 2020-02-11 NOTE — ED Notes (Signed)
2 unsuccessful IV attempt.

## 2020-02-11 NOTE — ED Triage Notes (Signed)
Pt arrives pov, c/o N/V x 1 week. Treated on 10/7 in ED for same symptoms. Pt reports decreased appetite, inability to keep down food or fluids. Pt endorses taking reglan at 0645 today. Actively vomiting in triage.

## 2020-02-11 NOTE — Discharge Instructions (Addendum)
Because you have a fever today and your urine has bacteria in it, we will treat you for a urinary tract infection with antibiotics.  Please take as prescribed until finished.  Use Phenergan as needed for nausea/vomiting.  Continue to drink plenty of fluids at home and take potassium supplement. Return to ER if fevers do not improve in 24 hours, if you have abdominal pain, or if you are vomiting and can't keep anything down.

## 2020-02-12 LAB — URINE CULTURE

## 2020-07-03 ENCOUNTER — Other Ambulatory Visit: Payer: Self-pay | Admitting: *Deleted

## 2020-07-03 DIAGNOSIS — R5381 Other malaise: Secondary | ICD-10-CM

## 2020-08-15 DIAGNOSIS — Z23 Encounter for immunization: Secondary | ICD-10-CM | POA: Diagnosis not present

## 2020-08-15 DIAGNOSIS — Z0000 Encounter for general adult medical examination without abnormal findings: Secondary | ICD-10-CM | POA: Diagnosis not present

## 2020-08-15 DIAGNOSIS — Z113 Encounter for screening for infections with a predominantly sexual mode of transmission: Secondary | ICD-10-CM | POA: Diagnosis not present

## 2020-08-15 DIAGNOSIS — Z13220 Encounter for screening for lipoid disorders: Secondary | ICD-10-CM | POA: Diagnosis not present

## 2021-01-01 DIAGNOSIS — Z20822 Contact with and (suspected) exposure to covid-19: Secondary | ICD-10-CM | POA: Diagnosis not present

## 2021-08-29 DIAGNOSIS — Z0000 Encounter for general adult medical examination without abnormal findings: Secondary | ICD-10-CM | POA: Diagnosis not present

## 2021-08-30 DIAGNOSIS — R635 Abnormal weight gain: Secondary | ICD-10-CM | POA: Diagnosis not present

## 2021-08-30 DIAGNOSIS — Z13220 Encounter for screening for lipoid disorders: Secondary | ICD-10-CM | POA: Diagnosis not present
